# Patient Record
Sex: Male | Born: 1975
Health system: Southern US, Community
[De-identification: ages and names within clinical notes are randomized; demographics above are authoritative.]

## PROBLEM LIST (undated history)

## (undated) DIAGNOSIS — I10 Essential (primary) hypertension: Secondary | ICD-10-CM

## (undated) HISTORY — PX: JOINT REPLACEMENT: SHX530

## (undated) HISTORY — DX: Essential (primary) hypertension: I10

---

## 1999-06-15 HISTORY — PX: APPENDECTOMY: SHX54

## 2009-03-11 ENCOUNTER — Emergency Department (HOSPITAL_COMMUNITY): Admission: EM | Admit: 2009-03-11 | Discharge: 2009-03-11 | Payer: Self-pay | Admitting: Family Medicine

## 2010-09-18 LAB — POCT INFECTIOUS MONO SCREEN: Mono Screen: NEGATIVE

## 2012-03-25 ENCOUNTER — Emergency Department (HOSPITAL_COMMUNITY)
Admission: EM | Admit: 2012-03-25 | Discharge: 2012-03-26 | Disposition: A | Discharge status: Home / Self Care | Payer: BC Managed Care – PPO | Attending: Emergency Medicine | Admitting: Emergency Medicine

## 2012-03-25 ENCOUNTER — Encounter (HOSPITAL_COMMUNITY): Payer: Self-pay | Admitting: Adult Health

## 2012-03-25 DIAGNOSIS — W260XXA Contact with knife, initial encounter: Secondary | ICD-10-CM | POA: Insufficient documentation

## 2012-03-25 DIAGNOSIS — S61209A Unspecified open wound of unspecified finger without damage to nail, initial encounter: Secondary | ICD-10-CM

## 2012-03-25 NOTE — ED Notes (Signed)
While cutting veggies sliced tuft of thumb on left hand off. CMS intact. Tetanus shot greater than 5 year.

## 2012-03-25 NOTE — ED Notes (Signed)
The pt has  An evulsion type laceration to the lt thumb distal tip.  The skin is missing.   He cut it with a knife while cooking.  Bleeding  Controlled somewhat with the bandage  Did not remove to clean yet until ready for treatment

## 2012-03-26 MED ORDER — TETANUS-DIPHTH-ACELL PERTUSSIS 5-2.5-18.5 LF-MCG/0.5 IM SUSP
0.5000 mL | Freq: Once | INTRAMUSCULAR | Status: AC
  Administered 2012-03-26: 0.5 mL via INTRAMUSCULAR
  Filled 2012-03-26: qty 0.5

## 2012-03-26 MED ORDER — HYDROCODONE-ACETAMINOPHEN 5-325 MG PO TABS: 1.0000 | ORAL_TABLET | ORAL | Status: DC | PRN

## 2012-03-26 NOTE — ED Provider Notes (Signed)
History     CSN: [id number]  Arrival date & time 03/25/12  2302   First MD Initiated Contact with Patient 03/25/12 2336      Chief Complaint  Patient presents with  . Laceration   HPI  History provided by the patient. Patient is a 36 year old male with no significant PMH who presents with laceration to the tip of his left thumb. Patient states he was chopping onions and became distracted and when he looks like his daughter dancing actively cut the tip of his left thumb with a knife. Patient reports having an avulsion type laceration to the tip. There has been continuous bleeding that seemed improved with pressure and bandage. Patient has no prior history of any bleeding disorder condition. Patient is not a daily alcohol user. He has not used other treatments for symptoms.    History reviewed. No pertinent past medical history.  History reviewed. No pertinent past surgical history.  History reviewed. No pertinent family history.  History  Substance Use Topics  . Smoking status: Never Smoker   . Smokeless tobacco: Not on file  . Alcohol Use: Yes      Review of Systems  All other systems reviewed and are negative.    Allergies  Review of patient's allergies indicates no known allergies.  Home Medications  No current outpatient prescriptions on file.  BP 149/81  Pulse 88  Temp 98.3 F (36.8 C) (Oral)  Resp 18  SpO2 98%  Physical Exam  Nursing note and vitals reviewed. Constitutional: He appears well-developed and well-nourished. No distress.  HENT:  Head: Normocephalic.  Cardiovascular: Normal rate and regular rhythm.   No murmur heard. Pulmonary/Chest: Effort normal and breath sounds normal.  Musculoskeletal:       Small avulsion/amputation injury to the tip of left finger pad of thumb. There is small continuous bleeding from the area.  Skin: Skin is warm.  Psychiatric: He has a normal mood and affect.    ED Course  Procedures  LACERATION  REPAIR Performed by: Angus Seller Authorized by: Angus Seller Consent: Verbal consent obtained. Risks and benefits: risks, benefits and alternatives were discussed Consent given by: patient Patient identity confirmed: provided demographic data Prepped and Draped in normal sterile fashion Wound explored  Laceration Location: Tip of left thumb  Laceration Length: 1 cm  No Foreign Bodies seen or palpated  Anesthesia: None   Irrigation method: Lavage  Amount of cleaning: standard  Skin closure: None   Patient tolerance: Patient tolerated the procedure well with no immediate complications.   1. Avulsion of finger tip       MDM  12:45 AM patient seen and evaluated. Patient's wound was cleaned and bandaged with good hemostasis. Patient given dressing changing instructions. Will also provide orthopedic hand followup.        Angus Seller, [place] 03/26/12 2221

## 2012-03-27 NOTE — ED Provider Notes (Signed)
Medical screening examination/treatment/procedure(s) were performed by non-physician practitioner and as supervising physician I was immediately available for consultation/collaboration.  Lavan Imes M Simya Tercero, MD 03/27/12 0550 

## 2015-08-06 ENCOUNTER — Other Ambulatory Visit: Payer: Self-pay | Admitting: Internal Medicine

## 2015-08-06 DIAGNOSIS — R945 Abnormal results of liver function studies: Secondary | ICD-10-CM

## 2015-08-08 ENCOUNTER — Ambulatory Visit
Admission: RE | Admit: 2015-08-08 | Discharge: 2015-08-08 | Disposition: A | Discharge status: Home / Self Care | Payer: Self-pay | Source: Ambulatory Visit | Attending: Internal Medicine | Admitting: Internal Medicine

## 2015-08-08 DIAGNOSIS — R945 Abnormal results of liver function studies: Secondary | ICD-10-CM

## 2015-09-15 DIAGNOSIS — L700 Acne vulgaris: Secondary | ICD-10-CM | POA: Diagnosis not present

## 2015-09-15 DIAGNOSIS — D225 Melanocytic nevi of trunk: Secondary | ICD-10-CM | POA: Diagnosis not present

## 2015-09-15 DIAGNOSIS — Z86018 Personal history of other benign neoplasm: Secondary | ICD-10-CM | POA: Diagnosis not present

## 2015-09-15 DIAGNOSIS — L570 Actinic keratosis: Secondary | ICD-10-CM | POA: Diagnosis not present

## 2016-03-19 DIAGNOSIS — Z23 Encounter for immunization: Secondary | ICD-10-CM | POA: Diagnosis not present

## 2016-09-20 DIAGNOSIS — Z86018 Personal history of other benign neoplasm: Secondary | ICD-10-CM | POA: Diagnosis not present

## 2016-09-20 DIAGNOSIS — D1801 Hemangioma of skin and subcutaneous tissue: Secondary | ICD-10-CM | POA: Diagnosis not present

## 2016-09-20 DIAGNOSIS — L814 Other melanin hyperpigmentation: Secondary | ICD-10-CM | POA: Diagnosis not present

## 2016-09-20 DIAGNOSIS — D225 Melanocytic nevi of trunk: Secondary | ICD-10-CM | POA: Diagnosis not present

## 2016-09-27 DIAGNOSIS — R05 Cough: Secondary | ICD-10-CM | POA: Diagnosis not present

## 2016-09-27 DIAGNOSIS — J0100 Acute maxillary sinusitis, unspecified: Secondary | ICD-10-CM | POA: Diagnosis not present

## 2016-09-27 DIAGNOSIS — J302 Other seasonal allergic rhinitis: Secondary | ICD-10-CM | POA: Diagnosis not present

## 2017-01-25 ENCOUNTER — Other Ambulatory Visit: Payer: Self-pay | Admitting: Internal Medicine

## 2017-01-25 DIAGNOSIS — I493 Ventricular premature depolarization: Secondary | ICD-10-CM

## 2017-01-25 DIAGNOSIS — F430 Acute stress reaction: Secondary | ICD-10-CM | POA: Diagnosis not present

## 2017-01-25 DIAGNOSIS — R030 Elevated blood-pressure reading, without diagnosis of hypertension: Secondary | ICD-10-CM | POA: Diagnosis not present

## 2017-01-25 DIAGNOSIS — Z6828 Body mass index (BMI) 28.0-28.9, adult: Secondary | ICD-10-CM | POA: Diagnosis not present

## 2017-01-27 ENCOUNTER — Other Ambulatory Visit: Payer: Self-pay

## 2017-01-27 ENCOUNTER — Encounter (INDEPENDENT_AMBULATORY_CARE_PROVIDER_SITE_OTHER): Payer: Self-pay

## 2017-01-27 ENCOUNTER — Ambulatory Visit (HOSPITAL_COMMUNITY): Payer: BLUE CROSS/BLUE SHIELD | Attending: Cardiology

## 2017-01-27 DIAGNOSIS — I493 Ventricular premature depolarization: Secondary | ICD-10-CM | POA: Diagnosis not present

## 2017-01-27 DIAGNOSIS — R030 Elevated blood-pressure reading, without diagnosis of hypertension: Secondary | ICD-10-CM | POA: Diagnosis not present

## 2017-02-02 ENCOUNTER — Telehealth (HOSPITAL_COMMUNITY): Payer: Self-pay | Admitting: *Deleted

## 2017-02-02 DIAGNOSIS — I493 Ventricular premature depolarization: Secondary | ICD-10-CM

## 2017-02-02 DIAGNOSIS — I471 Supraventricular tachycardia: Secondary | ICD-10-CM

## 2017-02-02 NOTE — Telephone Encounter (Signed)
Per Dr Shirlee Latch pt needs 24 hour holter for pvcs and svt and f/u appt w/him next week, order placed will schedule

## 2017-02-08 ENCOUNTER — Ambulatory Visit (INDEPENDENT_AMBULATORY_CARE_PROVIDER_SITE_OTHER): Payer: BLUE CROSS/BLUE SHIELD

## 2017-02-08 DIAGNOSIS — I471 Supraventricular tachycardia: Secondary | ICD-10-CM | POA: Diagnosis not present

## 2017-02-08 DIAGNOSIS — I493 Ventricular premature depolarization: Secondary | ICD-10-CM | POA: Diagnosis not present

## 2017-02-10 ENCOUNTER — Encounter (HOSPITAL_COMMUNITY): Payer: Self-pay | Admitting: Cardiology

## 2017-02-10 ENCOUNTER — Ambulatory Visit (HOSPITAL_COMMUNITY)
Admission: RE | Admit: 2017-02-10 | Discharge: 2017-02-10 | Disposition: A | Discharge status: Home / Self Care | Payer: BLUE CROSS/BLUE SHIELD | Source: Ambulatory Visit | Attending: Cardiology | Admitting: Cardiology

## 2017-02-10 VITALS — BP 145/94 | HR 64 | Wt 208.5 lb

## 2017-02-10 DIAGNOSIS — I10 Essential (primary) hypertension: Secondary | ICD-10-CM | POA: Diagnosis not present

## 2017-02-10 DIAGNOSIS — I493 Ventricular premature depolarization: Secondary | ICD-10-CM | POA: Diagnosis not present

## 2017-02-10 DIAGNOSIS — Z8249 Family history of ischemic heart disease and other diseases of the circulatory system: Secondary | ICD-10-CM | POA: Insufficient documentation

## 2017-02-10 DIAGNOSIS — F909 Attention-deficit hyperactivity disorder, unspecified type: Secondary | ICD-10-CM | POA: Diagnosis not present

## 2017-02-10 DIAGNOSIS — Z87891 Personal history of nicotine dependence: Secondary | ICD-10-CM | POA: Insufficient documentation

## 2017-02-10 MED ORDER — NEBIVOLOL HCL 5 MG PO TABS: 5.0000 mg | ORAL_TABLET | Freq: Every day | ORAL | 3 refills | Status: DC

## 2017-02-10 NOTE — Patient Instructions (Signed)
Treadmill Exercise Stress Test has been ordered for you.  Coronary Calcium Score Scan has been ordered for you.  START Bystolic 5 mg (1 Tablet) Once Daily at bedtime  Follow up in 2 Months

## 2017-02-10 NOTE — Progress Notes (Addendum)
PCP: Dr. Timothy Lassousso  41 yo with history of untreated HTN presents for evaluation of PVCs. He has felt PVCs for years, generally they happen infrequently, maybe once a week or so.  However, for the last 2-3 weeks, he has noted much more frequent PVCs. He feels palpitations during much of the day.  He notes them most when still or lying in bed. He has been having trouble sleeping because of the PVCs. He takes Adderrall for ADHD but stopped it about 2 wks ago with no improvement.  No new stressor recently.  No new medications.  He has good exercise tolerance, no chest pain or dyspnea.  He has lost 20 lbs in the last year with diet/exercise.  No syncope or lightheadedness.    ECG today shows PVCs and holter monitor done earlier this week shows 8.9% PVCs, no NSVT or VT.  Echo was done showing normal EF but moderate concentric LVH.  The RV looked normal.   ECG (personally reviewed): NSR, occasional PVCs  PMH: 1. PVCs: Monomorphic.  - Echo (8/18): EF 55-60%, moderate concentric LVH, normal RV size and systolic function.  - Holter monitor (8/18): 8.9% PVC burden, no run of NSVT or VT.   2. HTN 3. ADHD  FH: Maternal GF with PAD in his 9340s.  Mother with CVA at 267.  Maternal uncle with MI at 10938.  Maternal uncle with MI at 2870.   SH: Smoked in college, nothing since then.  Occasional ETOH.  Married with 3 children.  [place] in [place]   ROS: All systems reviewed and negative except as per HPI.   Current Outpatient Prescriptions  Medication Sig Dispense Refill  . ibuprofen (ADVIL,MOTRIN) 200 MG tablet Take 400 mg by mouth every 6 (six) hours as needed. For pain    . nebivolol (BYSTOLIC) 5 MG tablet Take 1 tablet (5 mg total) by mouth at bedtime. 30 tablet 3   No current facility-administered medications for this encounter.    BP (!) 145/94   Pulse 64   Wt 208 lb 8 oz (94.6 kg)   SpO2 100%   General: NAD Neck: No JVD, no thyromegaly or thyroid nodule.  Lungs: Clear to auscultation bilaterally with  normal respiratory effort. CV: Nondisplaced PMI.  Heart regular S1/S2, no S3/S4, no murmur.  No peripheral edema.  No carotid bruit.  Normal pedal pulses.  Abdomen: Soft, nontender, no hepatosplenomegaly, no distention.  Skin: Intact without lesions or rashes.  Neurologic: Alert and oriented x 3.  Psych: Normal affect. Extremities: No clubbing or cyanosis.  HEENT: Normal.   Assessment/Plan: 1. HTN: BP is elevated today.  He has an automatic cuff with readings from home, generally in the 140s/90s.  His BP has been high for a number of months and is still elevated despite significant weight gain.  The LVH on echo suggests that the elevated BP is not just a recent stress response to distress from the PVCs.  - I recommended that he start on nebivolol at 5 mg daily with high BP and frequent PVCs.   - Continue exercise/weight loss.  2. PVCs: He has noted PVCs for years but considerably more in the last 2-3 weeks.  They have been keeping him up/making it hard to sleep.  Holter showed monomorphic PVCs, no NSVT runs.  PVCs comprised 8.9% of his total QRS complexes.  This is not to the point where I would be concerned for the development of PVC-mediated cardiomyopathy, but this is a large number of PVCs.  No  family history of sudden death.  - As above, I will start him on nebivolol both for BP control and to try to decrease PVC frequency and give him some symptomatic relief.   - I will arrange an ETT to screen for ischemia and to see the response of the PVCs to exercise.   - He has moderate concentric LVH on echo.  This is most likely due to HTN, but must keep hypertrophic cardiomyopathy on the differential.  3. CAD risk: He has a strong family history of premature CAD.  - ETT as above for risk stratification.  - I would also like him to get a coronary calcium scan for risk stratification.  - Check lipids. If there is significant coronary calcium, low threshold for statin.   Marca Ancona 02/10/2017  Patient's ETT showed a hypertensive BP response but otherwise good exercise tolerance and no ischemic changes.  Decreased PVCs with exercise.  Hypertensive BP response.  Coronary calcium score 0, low risk for future events.  Lipids ok.   I reviewed with Dr. 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  Doubt ARVC.  Still having symptomatic PVCs so will increase bisoprolol to 10 mg daily with elevated BP.  If this does not help, will transition to verapamil CR.  PVC ablation would be an option if needed (hopefully not).   Marca Ancona 02/25/2017

## 2017-02-11 ENCOUNTER — Telehealth (HOSPITAL_COMMUNITY): Payer: Self-pay | Admitting: Pharmacist

## 2017-02-11 ENCOUNTER — Encounter (HOSPITAL_COMMUNITY): Payer: Self-pay | Admitting: Pharmacist

## 2017-02-11 MED ORDER — BISOPROLOL FUMARATE 5 MG PO TABS: 5.0000 mg | ORAL_TABLET | Freq: Every day | ORAL | 5 refills | Status: DC

## 2017-02-11 NOTE — Telephone Encounter (Signed)
Bystolic denied by [employer]. Will attempt appeal but in the meantime will start Mr. Ostrand on bisoprolol 5 mg daily per discussion with Dr. Shirlee LatchMcLean. Relayed info to Mr. Lubitz who verbalized understanding.   Tyler DeisErika K. Bonnye FavaNicolsen, PharmD, BCPS, CPP Clinical Pharmacist Pager: [phone] Phone: [phone] 02/11/2017 2:58 PM

## 2017-02-18 ENCOUNTER — Telehealth (HOSPITAL_COMMUNITY): Payer: Self-pay | Admitting: Pharmacist

## 2017-02-18 NOTE — Telephone Encounter (Signed)
Bystolic appeal again denied by [employer].   Tyler DeisErika K. Bonnye FavaNicolsen, PharmD, BCPS, CPP Clinical Pharmacist Pager: [phone] Phone: [phone] 02/18/2017 10:37 AM

## 2017-02-24 ENCOUNTER — Ambulatory Visit (INDEPENDENT_AMBULATORY_CARE_PROVIDER_SITE_OTHER): Payer: BLUE CROSS/BLUE SHIELD

## 2017-02-24 ENCOUNTER — Ambulatory Visit (INDEPENDENT_AMBULATORY_CARE_PROVIDER_SITE_OTHER)
Admission: RE | Admit: 2017-02-24 | Discharge: 2017-02-24 | Disposition: A | Discharge status: Home / Self Care | Payer: Self-pay | Source: Ambulatory Visit | Attending: Cardiology | Admitting: Cardiology

## 2017-02-24 ENCOUNTER — Other Ambulatory Visit: Payer: BLUE CROSS/BLUE SHIELD | Admitting: *Deleted

## 2017-02-24 DIAGNOSIS — Z8249 Family history of ischemic heart disease and other diseases of the circulatory system: Secondary | ICD-10-CM

## 2017-02-24 LAB — EXERCISE TOLERANCE TEST
CHL CUP MPHR: 181 {beats}/min
CHL CUP RESTING HR STRESS: 69 {beats}/min
CHL RATE OF PERCEIVED EXERTION: 15
CSEPEDS: 0 s
CSEPHR: 93 %
CSEPPHR: 169 {beats}/min
Estimated workload: 13.4 METS
Exercise duration (min): 11 min

## 2017-02-24 LAB — LIPID PANEL
CHOL/HDL RATIO: 4.1 ratio (ref 0.0–5.0)
Cholesterol, Total: 188 mg/dL (ref 100–199)
HDL: 46 mg/dL (ref >39)
LDL Calculated: 106 mg/dL — ABNORMAL HIGH (ref 0–99)
Triglycerides: 180 mg/dL — ABNORMAL HIGH (ref 0–149)
VLDL CHOLESTEROL CAL: 36 mg/dL (ref 5–40)

## 2017-02-24 NOTE — Addendum Note (Signed)
Addended by: Tonita PhoenixBOWDEN, Adaisha Campise K on: 02/24/2017 08:44 AM   Modules accepted: Orders

## 2017-02-25 NOTE — Addendum Note (Signed)
Encounter addended by: Laurey Morale, MD on: 02/25/2017  2:47 PM<BR>    Actions taken: Sign clinical note

## 2017-03-23 ENCOUNTER — Other Ambulatory Visit (HOSPITAL_COMMUNITY): Payer: Self-pay

## 2017-03-23 ENCOUNTER — Other Ambulatory Visit (HOSPITAL_COMMUNITY): Payer: Self-pay | Admitting: *Deleted

## 2017-03-23 MED ORDER — BISOPROLOL FUMARATE 5 MG PO TABS: 5.0000 mg | ORAL_TABLET | Freq: Every day | ORAL | 5 refills | Status: DC

## 2017-03-25 ENCOUNTER — Other Ambulatory Visit (HOSPITAL_COMMUNITY): Payer: Self-pay | Admitting: *Deleted

## 2017-03-25 MED ORDER — BISOPROLOL FUMARATE 10 MG PO TABS: 10.0000 mg | ORAL_TABLET | Freq: Every day | ORAL | 3 refills | Status: DC

## 2017-04-12 ENCOUNTER — Ambulatory Visit (HOSPITAL_COMMUNITY)
Admission: RE | Admit: 2017-04-12 | Discharge: 2017-04-12 | Disposition: A | Discharge status: Home / Self Care | Payer: BLUE CROSS/BLUE SHIELD | Source: Ambulatory Visit | Attending: Cardiology | Admitting: Cardiology

## 2017-04-12 ENCOUNTER — Encounter (HOSPITAL_COMMUNITY): Payer: Self-pay | Admitting: Cardiology

## 2017-04-12 VITALS — BP 141/84 | HR 56 | Wt 214.2 lb

## 2017-04-12 DIAGNOSIS — Z8249 Family history of ischemic heart disease and other diseases of the circulatory system: Secondary | ICD-10-CM | POA: Insufficient documentation

## 2017-04-12 DIAGNOSIS — I10 Essential (primary) hypertension: Secondary | ICD-10-CM | POA: Diagnosis not present

## 2017-04-12 DIAGNOSIS — I2510 Atherosclerotic heart disease of native coronary artery without angina pectoris: Secondary | ICD-10-CM | POA: Diagnosis not present

## 2017-04-12 DIAGNOSIS — E0590 Thyrotoxicosis, unspecified without thyrotoxic crisis or storm: Secondary | ICD-10-CM | POA: Diagnosis not present

## 2017-04-12 DIAGNOSIS — I493 Ventricular premature depolarization: Secondary | ICD-10-CM

## 2017-04-12 DIAGNOSIS — F909 Attention-deficit hyperactivity disorder, unspecified type: Secondary | ICD-10-CM | POA: Insufficient documentation

## 2017-04-12 LAB — BASIC METABOLIC PANEL
ANION GAP: 9 (ref 5–15)
BUN: 12 mg/dL (ref 6–20)
CALCIUM: 9.5 mg/dL (ref 8.9–10.3)
CO2: 26 mmol/L (ref 22–32)
Chloride: 102 mmol/L (ref 101–111)
Creatinine, Ser: 0.93 mg/dL (ref 0.61–1.24)
Glucose, Bld: 113 mg/dL — ABNORMAL HIGH (ref 65–99)
Potassium: 5.1 mmol/L (ref 3.5–5.1)
SODIUM: 137 mmol/L (ref 135–145)

## 2017-04-12 LAB — TSH: TSH: 1.343 u[IU]/mL (ref 0.350–4.500)

## 2017-04-12 MED ORDER — VERAPAMIL HCL ER 180 MG PO TBCR: 180.0000 mg | EXTENDED_RELEASE_TABLET | Freq: Every day | ORAL | 3 refills | Status: DC

## 2017-04-12 NOTE — Progress Notes (Signed)
PCP: Dr. Timothy Morgan Cardiology: Dr. Shirlee Morgan  41 yo with history of HTN and PVCs presents for followup of high BP and palpitations. He has felt PVCs for years, generally they happen infrequently, maybe once a week or so.  However, prior to his initial appointment, he noted much more frequent PVCs. He felt palpitations during much of the day.  He noted them most when still or lying in bed. He had trouble sleeping because of the PVCs.   Holter monitor in 8/18 showed 8.9% PVCs, no NSVT or VT.  Echo was done showing normal EF but moderate concentric LVH.  The RV looked normal.  ETT showed hypertensive BP response but no evidence for ischemia and good exercise tolerance.  PVCs decreased with exercise.  Coronary artery calcium score was 0.   I started him on bisoprolol and increased it to 10 mg daily.  He still feels PVCs but not as bad. He is sleeping better . SBP still running in the 140s-150s at times.  He feels more fatigued with bisoprolol. He does not feel PVCs with exertion. No exertional dyspnea or chest pain.  He gets frequent exercise.   Labs (9/18): LDL 106  PMH: 1. PVCs: Monomorphic.  - Echo (8/18): EF 55-60%, moderate concentric LVH, normal RV size and systolic function.  - Holter monitor (8/18): 8.9% PVC burden, no run of NSVT or VT.   - ETT (9/18):  Hypertensive BP response but otherwise good exercise tolerance and no ischemic changes.  Decreased PVCs with exercise. 2. HTN 3. ADHD 4. Coronary artery calcium score (9/18): 0 Agatston units.   FH: Maternal GF with PAD in his 60s.  Mother with CVA at 2.  Maternal uncle with MI at 6.  Maternal uncle with MI at 34.   SH: Smoked in college, nothing since then.  Occasional ETOH.  Married with 3 children.  Lives in [place].   ROS: All systems reviewed and negative except as per HPI.   Current Outpatient Prescriptions  Medication Sig Dispense Refill  . ibuprofen (ADVIL,MOTRIN) 200 MG tablet Take 400 mg by mouth every 6 (six) hours as needed.  For pain    . verapamil (CALAN-SR) 180 MG CR tablet Take 1 tablet (180 mg total) by mouth at bedtime. 30 tablet 3   No current facility-administered medications for this encounter.    BP (!) 141/84   Pulse (!) 56   Wt 214 lb 4 oz (97.2 kg)   SpO2 100%   General: NAD Neck: No JVD, no thyromegaly or thyroid nodule.  Lungs: Clear to auscultation bilaterally with normal respiratory effort. CV: Nondisplaced PMI.  Heart regular S1/S2, no S3/S4, no murmur.  No peripheral edema.  No carotid bruit.  Normal pedal pulses.  Abdomen: Soft, nontender, no hepatosplenomegaly, no distention.  Skin: Intact without lesions or rashes.  Neurologic: Alert and oriented x 3.  Psych: Normal affect. Extremities: No clubbing or cyanosis.  HEENT: Normal.   Assessment/Plan: 1. HTN: BP remains elevated on bisoprolol. As he has been fatigued with bisoprolol and still feels palpitations, I think that I will try a different agent.  - Stop bisoprolol and start verapamil CR 180 mg daily.  - He may end up needing a 2nd agent for BP control. - Continue exercise/weight loss.  2. PVCs: He has noted PVCs for years but considerably more in the last few weeks.  They were initially keeping him up/making it hard to sleep.  Holter showed monomorphic PVCs, no NSVT runs.  PVCs comprised 8.9% of  his total QRS complexes.  This is not to the point where I would be concerned for the development of PVC-mediated cardiomyopathy, but this is a large number of PVCs.  No family history of sudden death.  ETT showed no ischemic changes and PVCs decreased with exercise. Doubt ARVC.  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 Johney Morgan - who agrees).  - Some relief with bisoprolol but still with significant symptoms.  Stop bisoprolol and start verapamil CR 180 mg daily. Can titrate this up if needed. - He has moderate concentric LVH on echo.  This is most likely due to HTN and less likely from hypertrophic cardiomyopathy.  3. CAD  risk: He has a strong family history of premature CAD.  ETT was low risk.  Coronary artery calcium score 0 Agatston units suggests low risk for future cardiac events.  With excellent calcium score, would not treat with statin at this point (LDL 106).   Jeffery Morgan 04/12/2017

## 2017-04-12 NOTE — Patient Instructions (Signed)
Stop Bisoprolol, but do not discard medication  Start Verapamil 180 mg daily  Labs drawn today (if we do not call you, then your lab work was stable)   Your physician has requested that you regularly monitor and record your blood pressure readings at home. Please use the same machine at the same time of day to check your readings and record them and call them in in 2 weeks [phone] opt. 5  Your physician recommends that you schedule a follow-up appointment in: 2 month with Dr. Shirlee LatchMcLean

## 2017-06-15 ENCOUNTER — Encounter (HOSPITAL_COMMUNITY): Payer: Self-pay | Admitting: Cardiology

## 2017-06-15 ENCOUNTER — Ambulatory Visit (HOSPITAL_COMMUNITY)
Admission: RE | Admit: 2017-06-15 | Discharge: 2017-06-15 | Disposition: A | Discharge status: Home / Self Care | Payer: BLUE CROSS/BLUE SHIELD | Source: Ambulatory Visit | Attending: Cardiology | Admitting: Cardiology

## 2017-06-15 VITALS — BP 162/96 | HR 79 | Wt 225.8 lb

## 2017-06-15 DIAGNOSIS — F909 Attention-deficit hyperactivity disorder, unspecified type: Secondary | ICD-10-CM | POA: Diagnosis not present

## 2017-06-15 DIAGNOSIS — Z8249 Family history of ischemic heart disease and other diseases of the circulatory system: Secondary | ICD-10-CM | POA: Insufficient documentation

## 2017-06-15 DIAGNOSIS — I493 Ventricular premature depolarization: Secondary | ICD-10-CM | POA: Diagnosis not present

## 2017-06-15 DIAGNOSIS — I10 Essential (primary) hypertension: Secondary | ICD-10-CM | POA: Diagnosis not present

## 2017-06-15 MED ORDER — CHLORTHALIDONE 25 MG PO TABS: 12.5000 mg | ORAL_TABLET | Freq: Every day | ORAL | 3 refills | Status: DC

## 2017-06-15 MED ORDER — VERAPAMIL HCL ER 180 MG PO TBCR: 360.0000 mg | EXTENDED_RELEASE_TABLET | Freq: Every day | ORAL | 3 refills | Status: DC

## 2017-06-15 NOTE — Patient Instructions (Signed)
Increase Verapamil 360 mg (2 tabs) daily  Check and record BP daily and send in results in 2 weeks  Based on BP- Start Chlorthalidone 12.5 mg (1/2 tab) daily  Your physician recommends that you schedule a follow-up appointment in: 6 months with Dr. Shirlee LatchMcLean  (we will call you)

## 2017-06-16 NOTE — Progress Notes (Signed)
PCP: Dr. Timothy Lasso Cardiology: Dr. Shirlee Latch  42 y.o.with history of HTN and PVCs presents for followup of high BP and palpitations. He has felt PVCs for years, generally they happen infrequently, maybe once a week or so.  However, prior to his initial appointment, he noted much more frequent PVCs. He felt palpitations during much of the day.  He noted them most when still or lying in bed. He had trouble sleeping because of the PVCs.   Holter monitor in 8/18 showed 8.9% PVCs, no NSVT or VT.  Echo was done showing normal EF but moderate concentric LVH.  The RV looked normal.  ETT showed hypertensive BP response but no evidence for ischemia and good exercise tolerance.  PVCs decreased with exercise.  Coronary artery calcium score was 0.   I started him on bisoprolol and increased it to 10 mg daily.  He continued to feel PVCs and felt fatigued, so I switched him to verapamil CR 180 mg daily.  He seems to be doing better with verapamil, less palpitations and no fatigue.  He has gained about 11 lbs over the holidays.  BP continues to run high when he checks at home and is high here today as well.  No chest pain or exertional dyspnea.    Labs (9/18): LDL 106 Labs (10/18): K 5.1, creatinine 0.93, TSH normal  PMH: 1. PVCs: Monomorphic.  - Echo (8/18): EF 55-60%, moderate concentric LVH, normal RV size and systolic function.  - Holter monitor (8/18): 8.9% PVC burden, no run of NSVT or VT.   - ETT (9/18):  Hypertensive BP response but otherwise good exercise tolerance and no ischemic changes.  Decreased PVCs with exercise. 2. HTN 3. ADHD 4. Coronary artery calcium score (9/18): 0 Agatston units.   FH: Maternal GF with PAD in his 91s.  Mother with CVA at 43.  Maternal uncle with MI at 9.  Maternal uncle with MI at 36.   SH: Smoked in college, nothing since then.  Occasional ETOH.  Married with 3 children.  Lives in [place].   ROS: All systems reviewed and negative except as per HPI.   Current Outpatient  Medications  Medication Sig Dispense Refill  . ibuprofen (ADVIL,MOTRIN) 200 MG tablet Take 400 mg by mouth every 6 (six) hours as needed. For pain    . verapamil (CALAN-SR) 180 MG CR tablet Take 2 tablets (360 mg total) by mouth at bedtime. 60 tablet 3  . [START ON 06/29/2017] chlorthalidone (HYGROTON) 25 MG tablet Take 0.5 tablets (12.5 mg total) by mouth daily. 15 tablet 3   No current facility-administered medications for this encounter.    BP (!) 162/96   Pulse 79   Wt 225 lb 12.8 oz (102.4 kg)   SpO2 97%   General: NAD Neck: No JVD, no thyromegaly or thyroid nodule.  Lungs: Clear to auscultation bilaterally with normal respiratory effort. CV: Nondisplaced PMI.  Heart regular S1/S2, no S3/S4, no murmur.  No peripheral edema.  No carotid bruit.  Normal pedal pulses.  Abdomen: Soft, nontender, no hepatosplenomegaly, no distention.  Skin: Intact without lesions or rashes.  Neurologic: Alert and oriented x 3.  Psych: Normal affect. Extremities: No clubbing or cyanosis.  HEENT: Normal.   Assessment/Plan: 1. HTN: BP remains elevated on verapamil CR.   - Increase verapamil CR to 360 mg daily.  - He will check BP daily and let me know what pressures are running in about 2 wks.  If still high, will start chlorthalidone 12.5 mg  daily with BMET in 2 wks after that.  - Continue exercise/weight loss, gained weight over holidays.  2. PVCs: He has noted PVCs for years but considerably more earlier this year.  They were initially keeping him up/making it hard to sleep.  Holter showed monomorphic PVCs, no NSVT runs.  PVCs comprised 8.9% of his total QRS complexes.  This is not to the point where I would be concerned for the development of PVC-mediated cardiomyopathy, but this is a large number of PVCs.  No family history of sudden death.  ETT showed no ischemic changes and PVCs decreased with exercise. Doubt ARVC.  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 Johney FrameAllred - who agrees).  -  Verapamil CR seems to be doing a reasonable job of PVC suppression, and planning to increase dose today.  - He has moderate concentric LVH on echo.  This is most likely due to HTN and less likely from hypertrophic cardiomyopathy.  3. CAD risk: He has a strong family history of premature CAD.  ETT was low risk.  Coronary artery calcium score 0 Agatston units suggests low risk for future cardiac events.  With excellent calcium score, would not treat with statin at this point (LDL most recently 106).   Marca AnconaDalton Philicia Heyne 06/16/2017

## 2017-07-28 ENCOUNTER — Telehealth (HOSPITAL_COMMUNITY): Payer: Self-pay

## 2017-07-28 DIAGNOSIS — I493 Ventricular premature depolarization: Secondary | ICD-10-CM

## 2017-07-28 NOTE — Telephone Encounter (Signed)
Patient called back, lab appointment scheduled and bmet ordered.

## 2017-07-28 NOTE — Telephone Encounter (Signed)
Per Dr. Shirlee LatchMcLean  Pt needs BMET in 2 weeks.

## 2017-08-11 ENCOUNTER — Ambulatory Visit (HOSPITAL_COMMUNITY)
Admission: RE | Admit: 2017-08-11 | Discharge: 2017-08-11 | Disposition: A | Discharge status: Home / Self Care | Payer: BLUE CROSS/BLUE SHIELD | Source: Ambulatory Visit | Attending: Internal Medicine | Admitting: Internal Medicine

## 2017-08-11 DIAGNOSIS — I493 Ventricular premature depolarization: Secondary | ICD-10-CM

## 2017-08-11 LAB — BASIC METABOLIC PANEL
Anion gap: 12 (ref 5–15)
BUN: 12 mg/dL (ref 6–20)
CHLORIDE: 97 mmol/L — AB (ref 101–111)
CO2: 27 mmol/L (ref 22–32)
Calcium: 9.2 mg/dL (ref 8.9–10.3)
Creatinine, Ser: 1.01 mg/dL (ref 0.61–1.24)
GFR calc Af Amer: >60 mL/min (ref >60)
GLUCOSE: 111 mg/dL — AB (ref 65–99)
POTASSIUM: 4 mmol/L (ref 3.5–5.1)
Sodium: 136 mmol/L (ref 135–145)

## 2017-08-17 ENCOUNTER — Encounter (HOSPITAL_COMMUNITY): Payer: Self-pay

## 2017-08-17 ENCOUNTER — Telehealth (HOSPITAL_COMMUNITY): Payer: Self-pay

## 2017-08-17 NOTE — Telephone Encounter (Signed)
Notes recorded by Teresa CoombsGunter, Anays Detore, RN on 08/17/2017 at 11:40 AM EST I have been unable to reach this patient by phone. A letter is being sent.  ------  Notes recorded by Modesta MessingBrown, Jasmine Q, CMA on 08/11/2017 at 4:06 PM EST Left VM for pt to call for lab results ------  Notes recorded by Laurey MoraleMcLean, Dalton S, MD on 08/11/2017 at 2:38 PM EST Labs are ok. Please let him know.

## 2017-09-08 ENCOUNTER — Other Ambulatory Visit (HOSPITAL_COMMUNITY): Payer: Self-pay | Admitting: *Deleted

## 2017-09-08 MED ORDER — CHLORTHALIDONE 25 MG PO TABS: 25.0000 mg | ORAL_TABLET | Freq: Every day | ORAL | 3 refills | Status: DC

## 2017-09-20 DIAGNOSIS — L814 Other melanin hyperpigmentation: Secondary | ICD-10-CM | POA: Diagnosis not present

## 2017-09-20 DIAGNOSIS — D1801 Hemangioma of skin and subcutaneous tissue: Secondary | ICD-10-CM | POA: Diagnosis not present

## 2017-09-20 DIAGNOSIS — Z86018 Personal history of other benign neoplasm: Secondary | ICD-10-CM | POA: Diagnosis not present

## 2017-09-20 DIAGNOSIS — D225 Melanocytic nevi of trunk: Secondary | ICD-10-CM | POA: Diagnosis not present

## 2017-10-27 DIAGNOSIS — H109 Unspecified conjunctivitis: Secondary | ICD-10-CM | POA: Diagnosis not present

## 2018-01-21 IMAGING — CT CT HEART SCORING
2 series · 16 of 20 positions shown, 18 images · non-contrast
Comparison: None.

CLINICAL DATA: Risk stratification

EXAM:
Coronary Calcium Score
TECHNIQUE: The patient was scanned on a Siemens Force scanner. Axial
non-contrast 3 mm slices were carried out through the heart. The
data set was analyzed on a dedicated work station and scored using
the Agatson method.

[Series 3: casc 3.0 i36f 2 bestdiast 70 % · axial · 0.37mm/px · z∈[-238,-146]mm · 8 of 41 slices shown, 10 images]
[im 5/41  vessel]
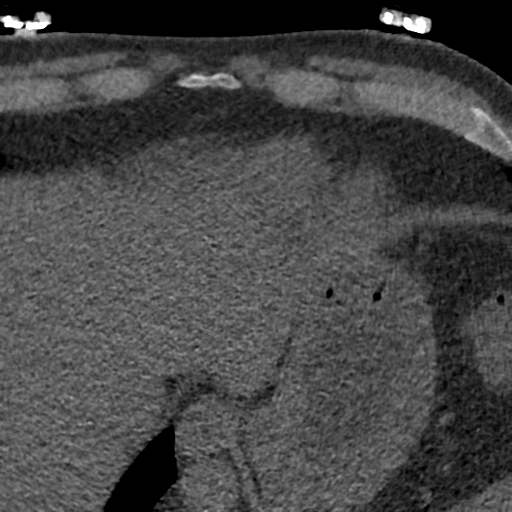
[im 5/41  lung]
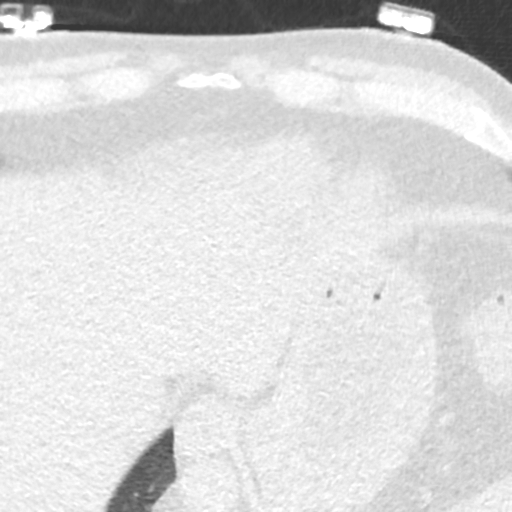
[im 9/41  vessel]
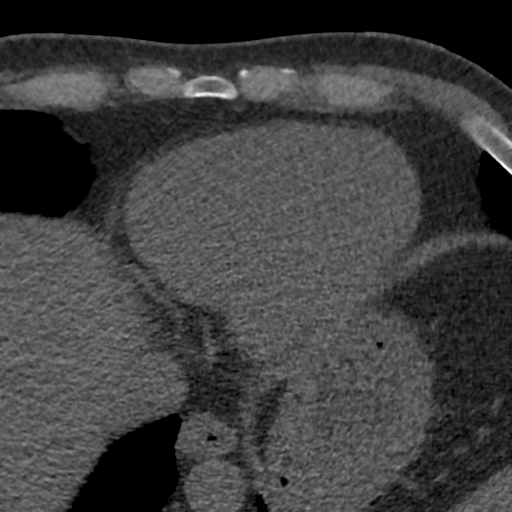
[im 14/41  vessel]
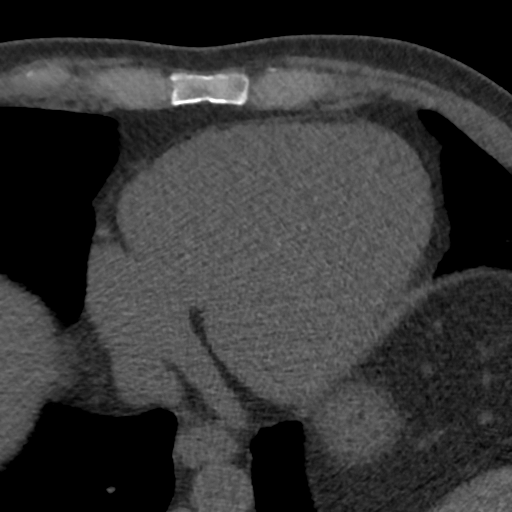
[im 18/41  vessel]
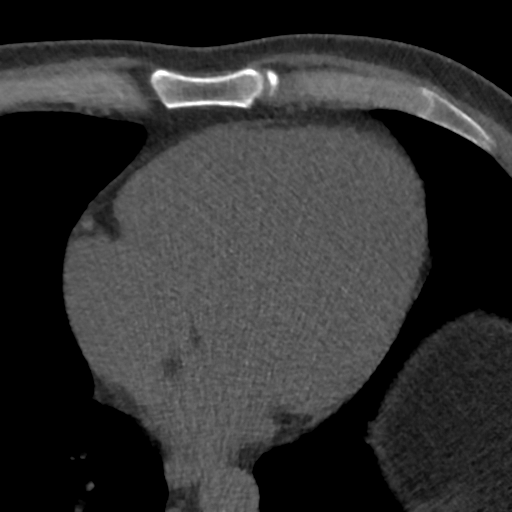
[im 23/41  vessel]
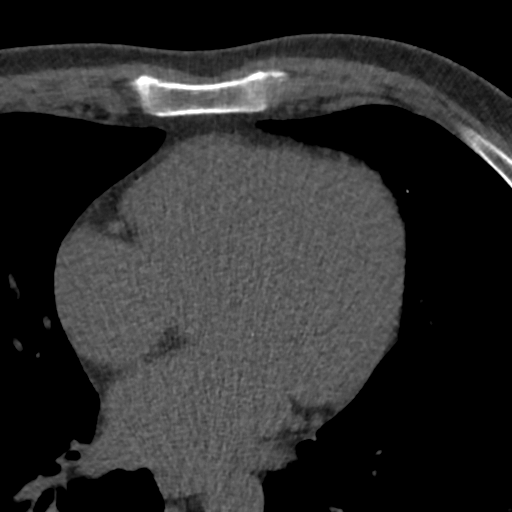
[im 23/41  lung]
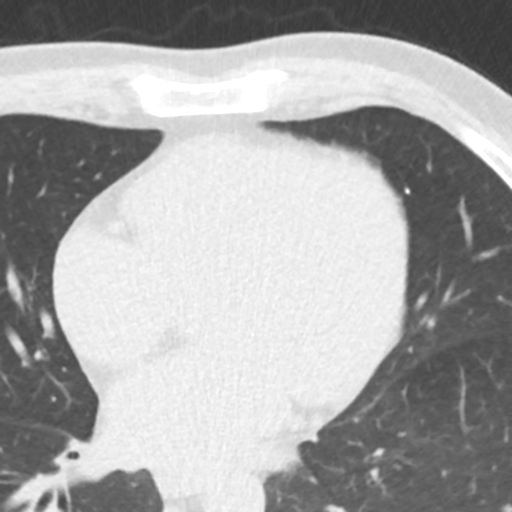
[im 27/41  vessel]
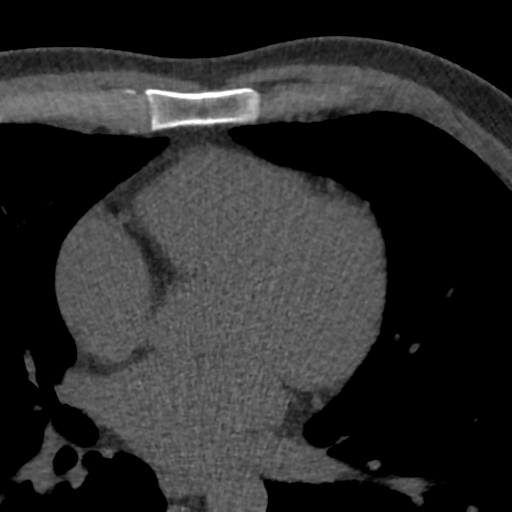
[im 32/41  vessel]
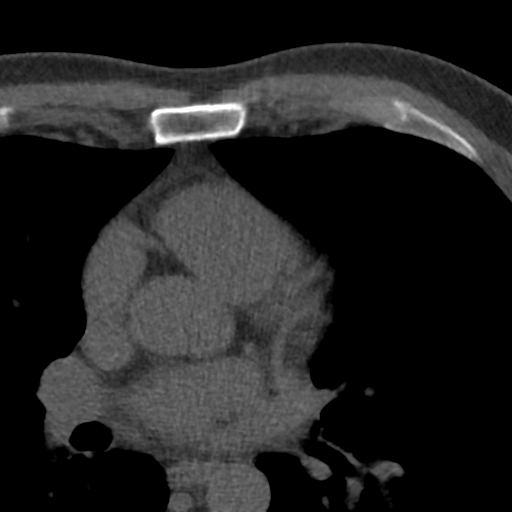
[im 36/41  vessel]
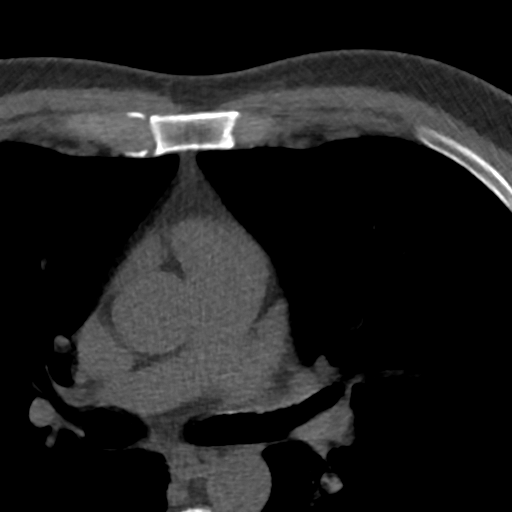

[Series 5: lung st 70 % · axial · 0.74mm/px · z∈[-238,-146]mm · 8 of 41 slices shown]
[im 5/41  lung]
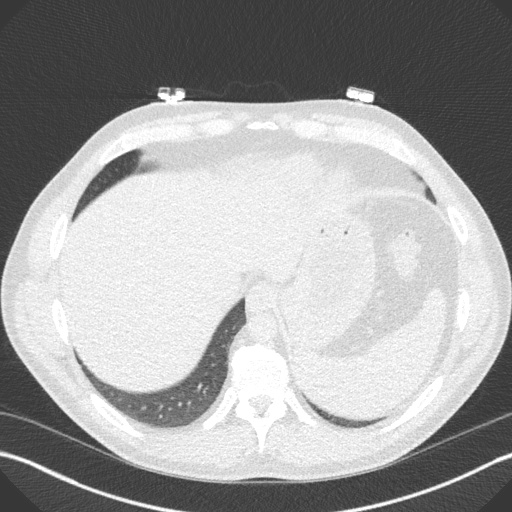
[im 9/41  lung]
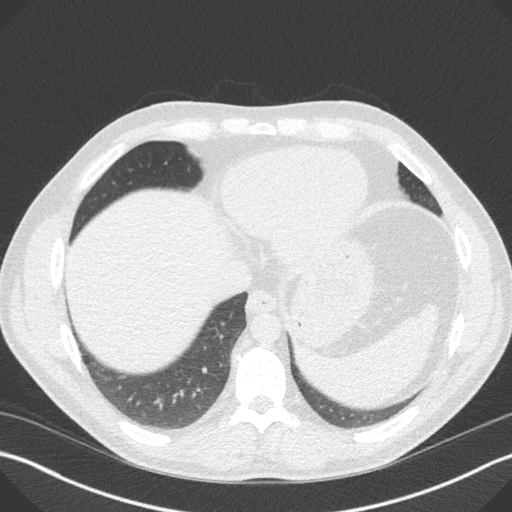
[im 14/41  lung]
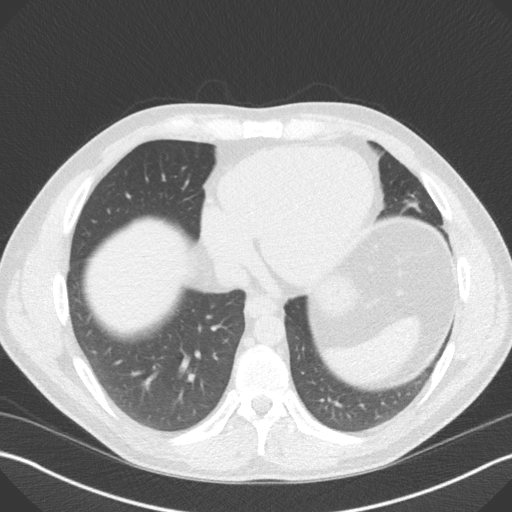
[im 18/41  lung]
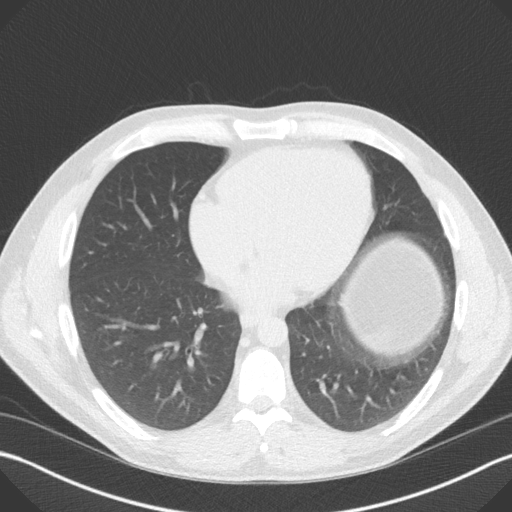
[im 23/41  lung]
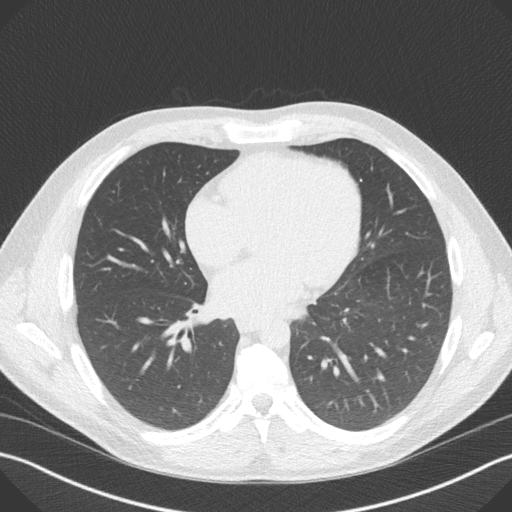
[im 27/41  lung]
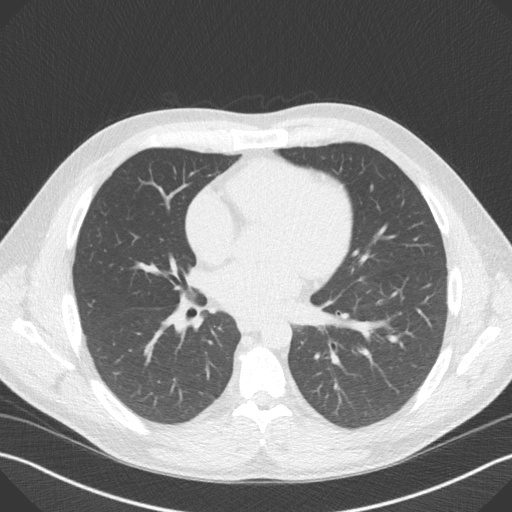
[im 32/41  lung]
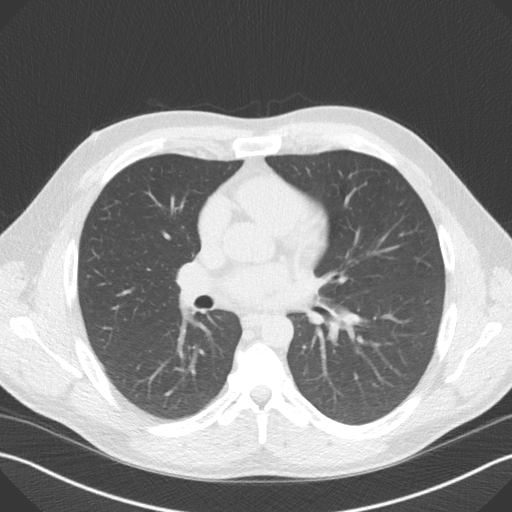
[im 36/41  lung]
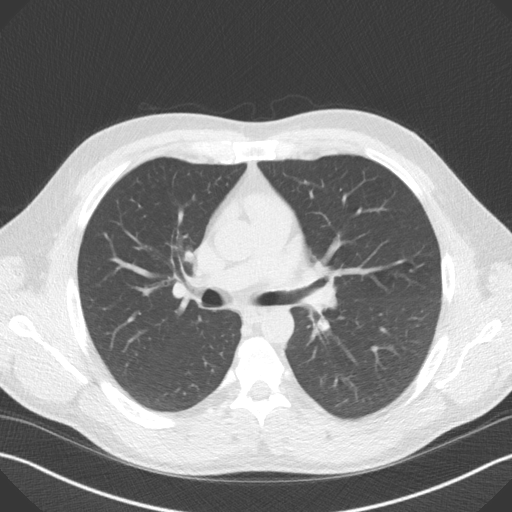

[16 of 20 positions shown; findings below may reference images not displayed]

FINDINGS: Non-cardiac: See separate report from [REDACTED].

Ascending Aorta:  Normal size, no calcifications.

Pericardium: Normal.

Coronary arteries:  Normal origin.
IMPRESSION: Coronary calcium score of 0. This was 0 percentile for age and sex
matched control.

Chelsie Jim

EXAM:
OVER-READ INTERPRETATION  CT CHEST

The following report is an over-read performed by radiologist Dr.
Julgen Ariela [REDACTED] on 02/24/2017. This over-read
does not include interpretation of cardiac or coronary anatomy or
pathology. The coronary calcium score interpretation by the
cardiologist is attached.
FINDINGS: Cardiovascular: Heart is normal size. Visualized aorta normal
caliber.

Mediastinum/Nodes: No adenopathy in the lower mediastinum or hila.

Lungs/Pleura: Visualized lungs clear.  No effusions.

Upper Abdomen: Imaging into the upper abdomen shows no acute
findings.

Musculoskeletal: Chest wall soft tissues are unremarkable. No acute
bony abnormality.
IMPRESSION: No acute or significant extracardiac abnormality.

## 2018-03-16 ENCOUNTER — Other Ambulatory Visit (HOSPITAL_COMMUNITY): Payer: Self-pay | Admitting: Cardiology

## 2018-03-16 DIAGNOSIS — I10 Essential (primary) hypertension: Secondary | ICD-10-CM

## 2018-03-17 ENCOUNTER — Other Ambulatory Visit (HOSPITAL_COMMUNITY): Payer: Self-pay

## 2018-03-17 DIAGNOSIS — I10 Essential (primary) hypertension: Secondary | ICD-10-CM

## 2018-03-17 MED ORDER — VERAPAMIL HCL ER 180 MG PO TBCR: 360.0000 mg | EXTENDED_RELEASE_TABLET | Freq: Every day | ORAL | 0 refills | Status: DC

## 2018-03-20 DIAGNOSIS — Z23 Encounter for immunization: Secondary | ICD-10-CM | POA: Diagnosis not present

## 2018-09-17 ENCOUNTER — Other Ambulatory Visit (HOSPITAL_COMMUNITY): Payer: Self-pay | Admitting: Cardiology

## 2018-09-17 DIAGNOSIS — I10 Essential (primary) hypertension: Secondary | ICD-10-CM

## 2018-09-20 ENCOUNTER — Other Ambulatory Visit (HOSPITAL_COMMUNITY): Payer: Self-pay | Admitting: Cardiology

## 2018-10-15 ENCOUNTER — Other Ambulatory Visit (HOSPITAL_COMMUNITY): Payer: Self-pay | Admitting: Cardiology

## 2018-10-15 DIAGNOSIS — I10 Essential (primary) hypertension: Secondary | ICD-10-CM

## 2018-10-26 ENCOUNTER — Other Ambulatory Visit (HOSPITAL_COMMUNITY): Payer: Self-pay | Admitting: Cardiology

## 2018-11-08 DIAGNOSIS — M25511 Pain in right shoulder: Secondary | ICD-10-CM | POA: Diagnosis not present

## 2018-11-18 ENCOUNTER — Other Ambulatory Visit (HOSPITAL_COMMUNITY): Payer: Self-pay | Admitting: Cardiology

## 2018-11-18 DIAGNOSIS — I10 Essential (primary) hypertension: Secondary | ICD-10-CM

## 2018-12-27 ENCOUNTER — Other Ambulatory Visit (HOSPITAL_COMMUNITY): Payer: Self-pay

## 2018-12-27 DIAGNOSIS — I10 Essential (primary) hypertension: Secondary | ICD-10-CM

## 2018-12-27 MED ORDER — VERAPAMIL HCL ER 180 MG PO TBCR: EXTENDED_RELEASE_TABLET | ORAL | 0 refills | Status: DC

## 2019-01-10 ENCOUNTER — Other Ambulatory Visit (HOSPITAL_COMMUNITY): Payer: Self-pay | Admitting: Cardiology

## 2019-01-10 DIAGNOSIS — I10 Essential (primary) hypertension: Secondary | ICD-10-CM

## 2019-02-12 DIAGNOSIS — L723 Sebaceous cyst: Secondary | ICD-10-CM | POA: Diagnosis not present

## 2019-02-12 DIAGNOSIS — D225 Melanocytic nevi of trunk: Secondary | ICD-10-CM | POA: Diagnosis not present

## 2019-02-12 DIAGNOSIS — Z86018 Personal history of other benign neoplasm: Secondary | ICD-10-CM | POA: Diagnosis not present

## 2019-02-12 DIAGNOSIS — L814 Other melanin hyperpigmentation: Secondary | ICD-10-CM | POA: Diagnosis not present

## 2019-05-29 DIAGNOSIS — Z20828 Contact with and (suspected) exposure to other viral communicable diseases: Secondary | ICD-10-CM | POA: Diagnosis not present

## 2019-05-31 DIAGNOSIS — Z20828 Contact with and (suspected) exposure to other viral communicable diseases: Secondary | ICD-10-CM | POA: Diagnosis not present

## 2019-06-02 DIAGNOSIS — Z20828 Contact with and (suspected) exposure to other viral communicable diseases: Secondary | ICD-10-CM | POA: Diagnosis not present

## 2019-07-03 ENCOUNTER — Other Ambulatory Visit: Payer: Self-pay

## 2019-07-03 ENCOUNTER — Ambulatory Visit: Payer: BLUE CROSS/BLUE SHIELD | Attending: Internal Medicine

## 2019-07-03 DIAGNOSIS — Z20822 Contact with and (suspected) exposure to covid-19: Secondary | ICD-10-CM

## 2019-07-04 LAB — NOVEL CORONAVIRUS, NAA: SARS-CoV-2, NAA: DETECTED — AB

## 2019-07-19 DIAGNOSIS — Z03818 Encounter for observation for suspected exposure to other biological agents ruled out: Secondary | ICD-10-CM | POA: Diagnosis not present

## 2019-07-19 DIAGNOSIS — M25532 Pain in left wrist: Secondary | ICD-10-CM | POA: Diagnosis not present

## 2019-07-20 DIAGNOSIS — M25531 Pain in right wrist: Secondary | ICD-10-CM | POA: Diagnosis not present

## 2019-07-20 DIAGNOSIS — M25532 Pain in left wrist: Secondary | ICD-10-CM | POA: Diagnosis not present

## 2019-07-23 DIAGNOSIS — U071 COVID-19: Secondary | ICD-10-CM | POA: Diagnosis not present

## 2019-07-23 DIAGNOSIS — R0602 Shortness of breath: Secondary | ICD-10-CM | POA: Diagnosis not present

## 2019-07-23 DIAGNOSIS — J3489 Other specified disorders of nose and nasal sinuses: Secondary | ICD-10-CM | POA: Diagnosis not present

## 2019-07-23 DIAGNOSIS — F430 Acute stress reaction: Secondary | ICD-10-CM | POA: Diagnosis not present

## 2019-07-25 DIAGNOSIS — Z03818 Encounter for observation for suspected exposure to other biological agents ruled out: Secondary | ICD-10-CM | POA: Diagnosis not present

## 2019-09-10 DIAGNOSIS — U071 COVID-19: Secondary | ICD-10-CM | POA: Diagnosis not present

## 2019-09-10 DIAGNOSIS — F430 Acute stress reaction: Secondary | ICD-10-CM | POA: Diagnosis not present

## 2019-10-12 DIAGNOSIS — Z0000 Encounter for general adult medical examination without abnormal findings: Secondary | ICD-10-CM | POA: Diagnosis not present

## 2019-10-12 DIAGNOSIS — Z79899 Other long term (current) drug therapy: Secondary | ICD-10-CM | POA: Diagnosis not present

## 2019-10-12 DIAGNOSIS — R739 Hyperglycemia, unspecified: Secondary | ICD-10-CM | POA: Diagnosis not present

## 2019-10-12 DIAGNOSIS — Z125 Encounter for screening for malignant neoplasm of prostate: Secondary | ICD-10-CM | POA: Diagnosis not present

## 2019-10-19 DIAGNOSIS — Z0000 Encounter for general adult medical examination without abnormal findings: Secondary | ICD-10-CM | POA: Diagnosis not present

## 2019-10-19 DIAGNOSIS — J302 Other seasonal allergic rhinitis: Secondary | ICD-10-CM | POA: Diagnosis not present

## 2019-10-19 DIAGNOSIS — F430 Acute stress reaction: Secondary | ICD-10-CM | POA: Diagnosis not present

## 2019-10-19 DIAGNOSIS — Z8616 Personal history of COVID-19: Secondary | ICD-10-CM | POA: Diagnosis not present

## 2019-10-19 DIAGNOSIS — R945 Abnormal results of liver function studies: Secondary | ICD-10-CM | POA: Diagnosis not present

## 2019-12-31 DIAGNOSIS — M25561 Pain in right knee: Secondary | ICD-10-CM | POA: Diagnosis not present

## 2019-12-31 DIAGNOSIS — M25461 Effusion, right knee: Secondary | ICD-10-CM | POA: Diagnosis not present

## 2019-12-31 DIAGNOSIS — S8001XA Contusion of right knee, initial encounter: Secondary | ICD-10-CM | POA: Diagnosis not present

## 2020-01-28 DIAGNOSIS — Z20828 Contact with and (suspected) exposure to other viral communicable diseases: Secondary | ICD-10-CM | POA: Diagnosis not present

## 2020-02-20 DIAGNOSIS — M25571 Pain in right ankle and joints of right foot: Secondary | ICD-10-CM | POA: Diagnosis not present

## 2020-02-25 DIAGNOSIS — L814 Other melanin hyperpigmentation: Secondary | ICD-10-CM | POA: Diagnosis not present

## 2020-02-25 DIAGNOSIS — L578 Other skin changes due to chronic exposure to nonionizing radiation: Secondary | ICD-10-CM | POA: Diagnosis not present

## 2020-02-25 DIAGNOSIS — Z86018 Personal history of other benign neoplasm: Secondary | ICD-10-CM | POA: Diagnosis not present

## 2020-02-25 DIAGNOSIS — D225 Melanocytic nevi of trunk: Secondary | ICD-10-CM | POA: Diagnosis not present

## 2020-02-27 DIAGNOSIS — M25571 Pain in right ankle and joints of right foot: Secondary | ICD-10-CM | POA: Diagnosis not present

## 2020-03-18 ENCOUNTER — Ambulatory Visit (INDEPENDENT_AMBULATORY_CARE_PROVIDER_SITE_OTHER): Payer: BC Managed Care – PPO | Admitting: Nurse Practitioner

## 2020-03-18 VITALS — BP 138/90 | HR 82 | Temp 97.3°F | Ht 72.0 in | Wt 214.0 lb

## 2020-03-18 DIAGNOSIS — Z91013 Allergy to seafood: Secondary | ICD-10-CM

## 2020-03-18 DIAGNOSIS — M2550 Pain in unspecified joint: Secondary | ICD-10-CM | POA: Diagnosis not present

## 2020-03-18 DIAGNOSIS — M2540 Effusion, unspecified joint: Secondary | ICD-10-CM

## 2020-03-18 DIAGNOSIS — F411 Generalized anxiety disorder: Secondary | ICD-10-CM

## 2020-03-18 DIAGNOSIS — R439 Unspecified disturbances of smell and taste: Secondary | ICD-10-CM

## 2020-03-18 MED ORDER — DULOXETINE HCL 30 MG PO CPEP: 30.0000 mg | ORAL_CAPSULE | Freq: Every day | ORAL | 0 refills | Status: DC

## 2020-03-18 NOTE — Assessment & Plan Note (Signed)
Multiple joint pain Joint swelling:  Will check labs - concerned for possible rheumatoid arthritis  Stay active  Stay well hydrated  Anxiety:  Anxiety has worsened since having covid.  Will order Cymbalta - please stop or call with any concerns  May continue xanax through PCP   Olfactory impairment:  Will place referral to neurology  Has tried essential oils without relief  New onset shellfish allergy:  Will place referral to allergy specialist    Follow up:  Follow up in 1 month for anxiety / starting Cymbalta

## 2020-03-18 NOTE — Patient Instructions (Addendum)
History of Covid Multiple joint pain Joint swelling:  Will check labs  Stay active  Stay well hydrated  Anxiety:  Will order Cymbalta - please stop or call with any concerns  May continue xanax through PCP   Olfactory impairment:  Will place referral to neurology  Has tried essential oils without relief  New onset shellfish allergy:  Will place referral to allergy specialist    Follow up:  Follow up in 1 month for anxiety / starting Cymbalta

## 2020-03-18 NOTE — Progress Notes (Signed)
@Patient  ID: III, male    DOB: 1975/07/12, 44 y.o.   MRN: [id number]  Chief Complaint  Patient presents with  . New Patient (Initial Visit)    COVID 06/2019 Sx; Smells smoke, joint pain, fatigue.     Referring provider: 07/2019, MD   44 year old male former smoker with history of ADD and anxiety.  Diagnosed with COVID January 2021.  HPI  Patient presents today for post COVID care clinic visit.  Patient states that he was diagnosed with Covid in January 2021.  Since that time he has been having ongoing altered taste and smell, anxiety, multiple joint pain and swelling.  Patient was evaluated by Ortho for right ankle swelling and left wrist swelling.  He states that they did draw fluid to evaluate for gout.  He states this come back negative.  Patient does have a history of anxiety prior to getting Covid.  He states that his anxiety has been worse since Covid.  He does see a psychiatrist.  He is currently taking Xanax as needed and that is prescribed by his PCP.  He states that he continually smells smoke or a rotting smell.  He states that his taste is still off.  Patient states that he has tried essential oils with no relief noted.  Patient are also reports having a recent allergic reaction to shellfish.  He states that he noted tongue swelling after eating shellfish.  He states that he did not go to the doctor for this and self-medicating with Benadryl.  Patient does have a primary care physician who has done recent lab work that was all within normal range.Denies f/c/s, n/v/d, hemoptysis, PND, chest pain or edema.      Allergies  Allergen Reactions  . Shrimp [Shellfish Allergy]     Immunization History  Administered Date(s) Administered  . Tdap 03/26/2012    Past Medical History:  Diagnosis Date  . Hypertension     Tobacco History: Social History   Tobacco Use  Smoking Status Never Smoker  Smokeless Tobacco Never Used   Counseling given:  Yes   Outpatient Encounter Medications as of 03/18/2020  Medication Sig  . ibuprofen (ADVIL,MOTRIN) 200 MG tablet Take 400 mg by mouth every 6 (six) hours as needed. For pain  . chlorthalidone (HYGROTON) 25 MG tablet TAKE 1 TABLET BY MOUTH EVERY DAY (Patient not taking: Reported on 03/18/2020)  . DULoxetine (CYMBALTA) 30 MG capsule Take 1 capsule (30 mg total) by mouth daily.  . sildenafil (VIAGRA) 100 MG tablet Take 100 mg by mouth as needed.  . verapamil (CALAN-SR) 180 MG CR tablet TAKE 2 TABLETS BY MOUTH AT BEDTIME (FUTURE REFILLS CALL [phone] TO MAKE OFFICE VISIT) (Patient not taking: Reported on 03/18/2020)   No facility-administered encounter medications on file as of 03/18/2020.     Review of Systems  Review of Systems  Constitutional: Negative.  Negative for fatigue and fever.  HENT: Negative.   Respiratory: Negative for cough and shortness of breath.   Cardiovascular: Negative.  Negative for chest pain, palpitations and leg swelling.  Gastrointestinal: Negative.   Musculoskeletal: Positive for arthralgias, joint swelling and myalgias.  Allergic/Immunologic: Negative.   Neurological: Negative.        Altered taste and smell.   Psychiatric/Behavioral: Negative.        Physical Exam  BP 138/90 (BP Location: Right Arm)   Pulse 82   Temp (!) 97.3 F (36.3 C)   Ht 6' (1.829 m)   05/18/2020  214 lb 0.1 oz (97.1 kg)   SpO2 98%   BMI 29.02 kg/m   Wt Readings from Last 5 Encounters:  03/18/20 214 lb 0.1 oz (97.1 kg)  06/15/17 225 lb 12.8 oz (102.4 kg)  04/12/17 214 lb 4 oz (97.2 kg)  02/10/17 208 lb 8 oz (94.6 kg)     Physical Exam Vitals and nursing note reviewed.  Constitutional:      General: He is not in acute distress.    Appearance: He is well-developed.  Cardiovascular:     Rate and Rhythm: Normal rate and regular rhythm.  Pulmonary:     Effort: Pulmonary effort is normal.     Breath sounds: Normal breath sounds.  Musculoskeletal:     Right lower leg: No  edema.     Left lower leg: No edema.  Skin:    General: Skin is warm and dry.  Neurological:     Mental Status: He is alert and oriented to person, place, and time.  Psychiatric:        Mood and Affect: Mood normal.        Behavior: Behavior normal.        Assessment & Plan:   Shellfish allergy Multiple joint pain Joint swelling:  Will check labs - concerned for possible rheumatoid arthritis  Stay active  Stay well hydrated  Anxiety:  Anxiety has worsened since having covid.  Will order Cymbalta - please stop or call with any concerns  May continue xanax through PCP   Olfactory impairment:  Will place referral to neurology  Has tried essential oils without relief  New onset shellfish allergy:  Will place referral to allergy specialist    Follow up:  Follow up in 1 month for anxiety / starting Cymbalta      Ivonne Andrew, NP 03/18/2020

## 2020-03-19 ENCOUNTER — Other Ambulatory Visit: Payer: Self-pay | Admitting: Nurse Practitioner

## 2020-03-19 DIAGNOSIS — Z8616 Personal history of COVID-19: Secondary | ICD-10-CM

## 2020-03-19 DIAGNOSIS — M2550 Pain in unspecified joint: Secondary | ICD-10-CM

## 2020-03-19 LAB — SEDIMENTATION RATE: Sed Rate: 13 mm/hr (ref 0–15)

## 2020-03-19 LAB — ANA: Anti Nuclear Antibody (ANA): NEGATIVE

## 2020-03-19 LAB — RHEUMATOID FACTOR: Rheumatoid fact SerPl-aCnc: <10 IU/mL (ref 0.0–13.9)

## 2020-03-19 LAB — C-REACTIVE PROTEIN: CRP: 1 mg/L (ref 0–10)

## 2020-03-20 DIAGNOSIS — M25461 Effusion, right knee: Secondary | ICD-10-CM | POA: Diagnosis not present

## 2020-03-31 ENCOUNTER — Encounter: Payer: Self-pay | Admitting: Physical Medicine & Rehabilitation

## 2020-04-04 DIAGNOSIS — M25461 Effusion, right knee: Secondary | ICD-10-CM | POA: Diagnosis not present

## 2020-04-11 ENCOUNTER — Other Ambulatory Visit: Payer: Self-pay | Admitting: Nurse Practitioner

## 2020-04-11 DIAGNOSIS — F411 Generalized anxiety disorder: Secondary | ICD-10-CM

## 2020-04-11 MED ORDER — DULOXETINE HCL 30 MG PO CPEP: 30.0000 mg | ORAL_CAPSULE | Freq: Every day | ORAL | 0 refills | Status: DC

## 2020-04-14 DIAGNOSIS — J3089 Other allergic rhinitis: Secondary | ICD-10-CM | POA: Diagnosis not present

## 2020-04-14 DIAGNOSIS — T781XXD Other adverse food reactions, not elsewhere classified, subsequent encounter: Secondary | ICD-10-CM | POA: Diagnosis not present

## 2020-04-14 DIAGNOSIS — J3081 Allergic rhinitis due to animal (cat) (dog) hair and dander: Secondary | ICD-10-CM | POA: Diagnosis not present

## 2020-04-14 DIAGNOSIS — J301 Allergic rhinitis due to pollen: Secondary | ICD-10-CM | POA: Diagnosis not present

## 2020-04-15 ENCOUNTER — Encounter: Payer: BC Managed Care – PPO | Admitting: Physical Medicine & Rehabilitation

## 2020-04-18 ENCOUNTER — Ambulatory Visit: Payer: BC Managed Care – PPO

## 2020-04-30 ENCOUNTER — Telehealth: Payer: Self-pay | Admitting: Internal Medicine

## 2020-04-30 NOTE — Telephone Encounter (Signed)
-----   Message from Ivonne Andrew, NP sent at 04/30/2020  8:07 AM EST ----- Regarding: RE: Medication Dosage This patient was supposed to have had a 1 month follow up scheduled to discuss this. I always follow up with patients 1 month after starting any anxiety medications. Orlene Plum could you schedule him for an appointment. It can be tele-visit if he can not come in. Thanks.  Archie Patten ----- Message ----- From: Eduard Clos Sent: 04/29/2020   2:29 PM EST To: Ivonne Andrew, NP Subject: Medication Dosage                              Patient requesting call back. Wants to speak to you about increasing the dose of his Cymbalta.

## 2020-04-30 NOTE — Telephone Encounter (Signed)
Pt was called to schedule appt. lvm to return call

## 2020-05-13 ENCOUNTER — Ambulatory Visit: Payer: BC Managed Care – PPO | Admitting: Allergy and Immunology

## 2020-05-21 ENCOUNTER — Other Ambulatory Visit: Payer: Self-pay

## 2020-05-21 ENCOUNTER — Ambulatory Visit (INDEPENDENT_AMBULATORY_CARE_PROVIDER_SITE_OTHER): Payer: Managed Care, Other (non HMO) | Admitting: Neurology

## 2020-05-21 ENCOUNTER — Other Ambulatory Visit: Payer: Self-pay | Admitting: Neurology

## 2020-05-21 ENCOUNTER — Encounter: Payer: Self-pay | Admitting: Neurology

## 2020-05-21 VITALS — BP 156/100 | HR 85 | Ht 72.0 in | Wt 219.0 lb

## 2020-05-21 DIAGNOSIS — R431 Parosmia: Secondary | ICD-10-CM | POA: Insufficient documentation

## 2020-05-21 DIAGNOSIS — R432 Parageusia: Secondary | ICD-10-CM | POA: Diagnosis not present

## 2020-05-21 DIAGNOSIS — R430 Anosmia: Secondary | ICD-10-CM | POA: Insufficient documentation

## 2020-05-21 MED ORDER — CARBAMAZEPINE ER 100 MG PO TB12
ORAL_TABLET | ORAL | 5 refills | Status: DC
Start: 2020-05-21 — End: 2020-05-21

## 2020-05-21 MED ORDER — CARBAMAZEPINE ER 100 MG PO TB12
ORAL_TABLET | ORAL | 5 refills | Status: DC
Start: 2020-05-21 — End: 2024-03-13

## 2020-05-21 NOTE — Progress Notes (Signed)
Provider:  Melvyn Novas, M D  Referring Provider: Creola Corn, MD Primary Care Physician:  Creola Corn, MD  Chief Complaint  Patient presents with  . New Patient (Initial Visit)    pt alone, rm 11. pt states that he had covid jan 2021. lost taste and smell. taste still off but mostly concern is certain smells have not recovered. it has been different things that can trigger.     HPI:  Jeffery Morgan is a 44 y.o. male and seen here on 05-21-2020 for Anosmia, referred by NP Angus Seller.  Jeffery Morgan reports that he was diagnosed with Covid on 07/03/2019 and that his family contracted the disease as well but he was the only one symptomatic.  His oldest child actually did never develop antibodies and never got the infection.  He lost taste and smell, and his taste is still somewhat off but his main concern is that he has noted a persistent inability to distinguish and differentiate certain smells and scents.  At times he smell something that is not truly there or it has a misperception of an unpleasant smell associated with something that was usually perceived as pleasant.  He reports that his daughter's fruity shampoos are to him musty and moldy. Also reports an olfactory sensation of rotten food or burning / smoke scents.  He seems not to be able to distinguish smoke.   He has started using essential oils at home but has not had recovery/ success - used these off and on for 2 month.   Orange double failure. No scent no taste. Peppermint- 'cleaning product' , double failure.  Coconut- no smell no taste, thought of vanilla.  Vanilla - identified by scent . Not taste.  Rum- unsure, something sweet.   Has a lot of parosmia and severe los of smell and taste.       Review of Systems: Out of a complete 14 system review, the patient complains of only the following symptoms, and all other reviewed systems are negative. See above   Social History   Socioeconomic History  .  Marital status: Married    Spouse name: Not on file  . Number of children: Not on file  . Years of education: Not on file  . Highest education level: Not on file  Occupational History  . Not on file  Tobacco Use  . Smoking status: Never Smoker  . Smokeless tobacco: Never Used  Substance and Sexual Activity  . Alcohol use: Yes    Comment: occ  . Drug use: No  . Sexual activity: Yes  Other Topics Concern  . Not on file  Social History Narrative  . Not on file   Social Determinants of Health   Financial Resource Strain:   . Difficulty of Paying Living Expenses: Not on file  Food Insecurity:   . Worried About Programme researcher, broadcasting/film/video in the Last Year: Not on file  . Ran Out of Food in the Last Year: Not on file  Transportation Needs:   . Lack of Transportation (Medical): Not on file  . Lack of Transportation (Non-Medical): Not on file  Physical Activity:   . Days of Exercise per Week: Not on file  . Minutes of Exercise per Session: Not on file  Stress:   . Feeling of Stress : Not on file  Social Connections:   . Frequency of Communication with Friends and Family: Not on file  . Frequency of Social Gatherings with Friends  and Family: Not on file  . Attends Religious Services: Not on file  . Active Member of Clubs or Organizations: Not on file  . Attends Banker Meetings: Not on file  . Marital Status: Not on file  Intimate Partner Violence:   . Fear of Current or Ex-Partner: Not on file  . Emotionally Abused: Not on file  . Physically Abused: Not on file  . Sexually Abused: Not on file    Family History  Problem Relation Age of Onset  . Stroke Mother   . Heart disease Maternal Grandfather     Past Medical History:  Diagnosis Date  . Hypertension     Past Surgical History:  Procedure Laterality Date  . JOINT REPLACEMENT      Current Outpatient Medications  Medication Sig Dispense Refill  . DULoxetine (CYMBALTA) 20 MG capsule Take 20 mg by mouth 2  (two) times daily.    Marland Kitchen ibuprofen (ADVIL,MOTRIN) 200 MG tablet Take 400 mg by mouth every 6 (six) hours as needed. For pain    . sildenafil (VIAGRA) 100 MG tablet Take 100 mg by mouth as needed.     No current facility-administered medications for this visit.    Allergies as of 05/21/2020 - Review Complete 05/21/2020  Allergen Reaction Noted  . Shrimp [shellfish allergy]  03/18/2020    Vitals: BP (!) 156/100   Pulse 85   Ht 6' (1.829 m)   Wt 219 lb (99.3 kg)   BMI 29.70 kg/m  Last Weight:  Wt Readings from Last 1 Encounters:  05/21/20 219 lb (99.3 kg)   Last Height:   Ht Readings from Last 1 Encounters:  05/21/20 6' (1.829 m)    Physical exam:  General: The patient is awake, alert and appears not in acute distress.  The patient is well groomed. Head: Normocephalic, atraumatic. Neck is supple.  Cardiovascular:  Regular rate and rhythm , without  murmurs or carotid bruit, and without distended neck veins. Respiratory: Lungs are clear to auscultation. Skin:  Without evidence of edema, or rash Trunk: BMI is 29 and patient  has normal posture.  Neurologic exam : The patient is awake and alert, oriented to place and time.  Memory subjective described as intact. There is a normal attention span & concentration ability. Speech is fluent without  dysarthria, dysphonia or aphasia. Mood and affect are appropriate.  Cranial nerves: Pupils are equal and briskly reactive to light. Extraocular movements  in vertical and horizontal planes intact and without nystagmus.  Visual fields by finger perimetry are intact. Hearing to finger rub intact.  Facial sensation intact to fine touch. Facial motor strength is symmetric and tongue and uvula move midline. Tongue protrusion into either cheek is normal. Shoulder shrug is normal.   Motor exam:  Normal tone ,muscle bulk and symmetric strength in all extremities. He reports myalgia and  joint pain.   Sensory:  Fine touch, pinprick and  vibration were  normal.  Coordination: Rapid alternating movements in the fingers/hands were normal.  Gait and station: Patient walks without assistive device  Deep tendon reflexes: in the upper and lower extremities are symmetric and intact.   Assessment:  After physical and neurologic examination, review of laboratory studies, imaging, neurophysiology testing and pre-existing records, assessment is that of :   Jeffery Morgan has been unable to identify 3 categories of major scent families and he had also trouble to identify them by taste.  This is ageusia and anosmia this parosmia describing the perception  of unpleasant smells mainly a form of olfactory misperception sometimes based in a sentence that is perceived as something else and sometimes out of the blue without any trigger identifiable by others.  Plan:  Treatment plan and additional workup :  Continue visual and olfactory association training.  Start tegretol for parosmia, 100 mg at night, can increase to bid if tolerated.  Theophylline nasal spray compounded by combining saline nasal spray 30 ml with 20 mg of theophylline. Use twice daily in each nostril.   Keep a journal. Rv in 3 month , can be virtual .   Melvyn Novas MD 05/21/2020   Loss of smell, also known as ANOSMIA, is one of the most common symptoms of COVID-19.   [facility] Professor of Otolaryngology Loetta Rough, MD, and Assistant Professor Monika Salk, MD, have investigated several treatments for persistent anosmia (loss of smell), with a special interest in viral-related anosmia.     As the number of total, confirmed COVID-19 cases increases so does the number of people suffering from disease-related anosmia, making anosmia a significant public health problem.     Rehabilitation of anosmia :   Smell daily one sample of the following categories; and look at a picture of the object - f. Example at a picture of a lemon while smelling a  lemon oil.   A floral scent  - such as rose, lavender, or vanilla - all would qualify for these.  A spice scent - nutmeg, anise, coffee, cinnamon-  A citrus smell - lemon, lime, orange  A menthol or minty scent, or eucalyptus.   Goal is to re-associate scent ( and eventually taste ) to the image.  The success is gradual, and this form of rehabilitation may take 12 month to succeed.   Another complication is PAROSMIA - smelling something that is not present, often an unpleasant smell of burning rubber or spoiled , foul smells.   This can be reduced by using a carbamazepine at 100 mg po bid. This antiepileptic medication slows the nerve conduction and allows the reduction in abnormal smell sensation.    Melvyn Novas, MD

## 2020-05-21 NOTE — Patient Instructions (Signed)
Loss of smell, also known as ANOSMIA, is one of the most common symptoms of COVID-19.   [facility] Professor of Otolaryngology Loetta Rough, MD, and Assistant Professor Monika Salk, MD, have investigated several treatments for persistent anosmia (loss of smell), with a special interest in viral-related anosmia.     As the number of total, confirmed COVID-19 cases increases so does the number of people suffering from disease-related anosmia, making anosmia a significant public health problem.     Rehabilitation of anosmia :   Smell daily one sample of the following categories; and look at a picture of the object - f. Example at a picture of a lemon while smelling a lemon oil.   A floral scent  - such as rose, lavender, or vanilla - all would qualify for these.  A spice scent - nutmeg, anise, coffee, cinnamon-  A citrus smell - lemon, lime, orange  A menthol or minty scent, or eucalyptus.   Goal is to re-associate scent ( and eventually taste ) to the image.  The success is gradual, and this form of rehabilitation may take 12 month to succeed.   Another complication is PAROSMIA - smelling something that is not present, often an unpleasant smell of burning rubber or spoiled , foul smells.   This can be reduced by using a carbamazepine at 100 mg po bid. This antiepileptic medication slows the nerve conduction and allows the reduction in abnormal smell sensation.    Melvyn Novas, MD        Treatment of ANOSMIA and Ageusia:  If the combination of smell and vision of the same object that you're smelling improves the recovery of smell, then just smell alone. So we call that bimodal, visual and olfactory stimulation. So the standard olfactory training is four scents: rose, lemon, cloves and eucalyptus.  And the subject sniffs twice a day, 10, 12, 20 seconds each time the four essential oils for anywhere to six, eight or 12 weeks.  And olfactory training for anosmia  has been around for maybe 10, 15 years, studies suggest it works.  There's nothing absolutely definitive- but it can't hurt. It's thought to work by retraining the brain, the process of neuroplasticity, retraining the brain to smell these odors again. And we think that by smelling, you can retrain the brain. Now, one of the outstanding questions is, are you retraining the brain to smell rose, lemon, cloves and eucalyptus better, but forget about coffee, vanilla, coconut?  How generalizable is the improvement in smell? Rosanne Ashing, we asked the patients what smells would you like to pick and train on? The answer was fascinating to [place]. I thought it might be coffee as a coffee lover. I thought it might be vanilla as a vanilla-bean ice cream lover. It was smoke. The number one odor, smell, if you will, that people wanted to train on and to get better was smoke for the reasons that we talked about before.  The sense of smell is first and foremost attached to safety- these people felt that if they could smell smoke better, they would feel more safe.  Unfortunately, there isn't a smoke essential oil so we couldn't do that.  But it just reinforced to [place] how important it is for patients to pick what smells they want to train on.  The other unique aspect that should be brought up is that half of the group are looking at high-quality pictures of the item, one of four items they picked, and  each one of them have high-quality photographs presented on their iPhone or iPad at the same time that they smell.

## 2020-05-22 ENCOUNTER — Telehealth: Payer: Self-pay | Admitting: Neurology

## 2020-05-22 LAB — COMPREHENSIVE METABOLIC PANEL
ALT: 31 IU/L (ref 0–44)
AST: 24 IU/L (ref 0–40)
Albumin/Globulin Ratio: 2.2 (ref 1.2–2.2)
Albumin: 4.7 g/dL (ref 4.0–5.0)
Alkaline Phosphatase: 56 IU/L (ref 44–121)
BUN/Creatinine Ratio: 17 (ref 9–20)
BUN: 16 mg/dL (ref 6–24)
Bilirubin Total: 0.4 mg/dL (ref 0.0–1.2)
CO2: 23 mmol/L (ref 20–29)
Calcium: 9.5 mg/dL (ref 8.7–10.2)
Chloride: 101 mmol/L (ref 96–106)
Creatinine, Ser: 0.96 mg/dL (ref 0.76–1.27)
GFR calc Af Amer: 112 mL/min/{1.73_m2} (ref >59)
GFR calc non Af Amer: 97 mL/min/{1.73_m2} (ref >59)
Globulin, Total: 2.1 g/dL (ref 1.5–4.5)
Glucose: 95 mg/dL (ref 65–99)
Potassium: 4.5 mmol/L (ref 3.5–5.2)
Sodium: 140 mmol/L (ref 134–144)
Total Protein: 6.8 g/dL (ref 6.0–8.5)

## 2020-05-22 LAB — CBC WITH DIFFERENTIAL/PLATELET
Basophils Absolute: 0.1 10*3/uL (ref 0.0–0.2)
Basos: 1 %
EOS (ABSOLUTE): 0.1 10*3/uL (ref 0.0–0.4)
Eos: 1 %
Hematocrit: 48.6 % (ref 37.5–51.0)
Hemoglobin: 17.3 g/dL (ref 13.0–17.7)
Immature Grans (Abs): 0 10*3/uL (ref 0.0–0.1)
Immature Granulocytes: 1 %
Lymphocytes Absolute: 2 10*3/uL (ref 0.7–3.1)
Lymphs: 33 %
MCH: 32.5 pg (ref 26.6–33.0)
MCHC: 35.6 g/dL (ref 31.5–35.7)
MCV: 91 fL (ref 79–97)
Monocytes Absolute: 0.9 10*3/uL (ref 0.1–0.9)
Monocytes: 14 %
Neutrophils Absolute: 3.2 10*3/uL (ref 1.4–7.0)
Neutrophils: 50 %
Platelets: 216 10*3/uL (ref 150–450)
RBC: 5.33 x10E6/uL (ref 4.14–5.80)
RDW: 11.7 % (ref 11.6–15.4)
WBC: 6.2 10*3/uL (ref 3.4–10.8)

## 2020-05-22 NOTE — Progress Notes (Signed)
All normal metabolic panel - normal CBC- you may start on Carbamazepine.

## 2020-05-22 NOTE — Telephone Encounter (Signed)
Called the pt and reviewed the normal lab results. Advised the pt to start the medication. Pt verbalized understanding. Pt had no questions at this time but was encouraged to call back if questions arise.

## 2020-05-22 NOTE — Telephone Encounter (Signed)
-----   Message from Melvyn Novas, MD sent at 05/22/2020  3:49 PM EST ----- All normal metabolic panel - normal CBC- you may start on Carbamazepine.

## 2020-06-19 ENCOUNTER — Ambulatory Visit: Payer: BC Managed Care – PPO | Admitting: Neurology

## 2020-09-02 ENCOUNTER — Telehealth (INDEPENDENT_AMBULATORY_CARE_PROVIDER_SITE_OTHER): Payer: Managed Care, Other (non HMO) | Admitting: Adult Health

## 2020-09-02 DIAGNOSIS — R430 Anosmia: Secondary | ICD-10-CM

## 2020-09-02 DIAGNOSIS — R432 Parageusia: Secondary | ICD-10-CM

## 2020-09-02 DIAGNOSIS — R431 Parosmia: Secondary | ICD-10-CM | POA: Diagnosis not present

## 2020-09-02 NOTE — Progress Notes (Signed)
PATIENT: Jeffery Morgan DOB: 1976/02/10  REASON FOR VISIT: follow up HISTORY FROM: patient  Virtual Visit via Video Note  I connected with Jeffery Morgan on 09/02/20 at  3:00 PM EDT by a video enabled telemedicine application located remotely at [facility] and verified that I am speaking with the correct person using two identifiers who was located at their own home.   I discussed the limitations of evaluation and management by telemedicine and the availability of in person appointments. The patient expressed understanding and agreed to proceed.   PATIENT: Jeffery Morgan DOB: Dec 06, 1975  REASON FOR VISIT: follow up HISTORY FROM: patient  HISTORY OF PRESENT ILLNESS: Today 09/02/20:  Mr. Carithers is a 46 year old male with a history of loss of taste and smell after Covid infection in addition to parasomnia.  At his last visit with Dr. Vickey Huger he was started on carbamazepine for parasomnia.  He reports that he is no longer smelling smoke or rotten food.  Unfortunately reports that he still cannot smell or taste a lot of things.  He has continued to do smell/taste training with essential oils.  HISTORY (Copied from Dr.Dohmeier's note) Jeffery Morgan is a 45 y.o. male and seen here on 05-21-2020 for Anosmia, referred by NP Angus Seller.  Mr. Bennie Hind reports that he was diagnosed with Covid on 07/03/2019 and that his family contracted the disease as well but he was the only one symptomatic.  His oldest child actually did never develop antibodies and never got the infection.  He lost taste and smell, and his taste is still somewhat off but his main concern is that he has noted a persistent inability to distinguish and differentiate certain smells and scents.  At times he smell something that is not truly there or it has a misperception of an unpleasant smell associated with something that was usually perceived as pleasant.  He reports that his  daughter's fruity shampoos are to him musty and moldy. Also reports an olfactory sensation of rotten food or burning / smoke scents.  He seems not to be able to distinguish smoke.   He has started using essential oils at home but has not had recovery/ success - used these off and on for 2 month.   Orange double failure. No scent no taste. Peppermint- 'cleaning product' , double failure.  Coconut- no smell no taste, thought of vanilla.  Vanilla - identified by scent . Not taste.  Rum- unsure, something sweet.   Has a lot of parosmia and severe los of smell and taste.   REVIEW OF SYSTEMS: Out of a complete 14 system review of symptoms, the patient complains only of the following symptoms, and all other reviewed systems are negative.  See HPI  ALLERGIES: Allergies  Allergen Reactions  . Shrimp [Shellfish Allergy]     HOME MEDICATIONS: Outpatient Medications Prior to Visit  Medication Sig Dispense Refill  . carbamazepine (TEGRETOL XR) 100 MG 12 hr tablet Take one in AM po for 14 days then , if tolerated, increase to bid. 60 tablet 5  . DULoxetine (CYMBALTA) 20 MG capsule Take 20 mg by mouth 2 (two) times daily.    Marland Kitchen ibuprofen (ADVIL,MOTRIN) 200 MG tablet Take 400 mg by mouth every 6 (six) hours as needed. For pain    . sildenafil (VIAGRA) 100 MG tablet Take 100 mg by mouth as needed.     No facility-administered medications prior to visit.    PAST MEDICAL HISTORY: Past  Medical History:  Diagnosis Date  . Hypertension     PAST SURGICAL HISTORY: Past Surgical History:  Procedure Laterality Date  . JOINT REPLACEMENT      FAMILY HISTORY: Family History  Problem Relation Age of Onset  . Stroke Mother   . Heart disease Maternal Grandfather     SOCIAL HISTORY: Social History   Socioeconomic History  . Marital status: Married    Spouse name: Not on file  . Number of children: Not on file  . Years of education: Not on file  . Highest education level: Not on file   Occupational History  . Not on file  Tobacco Use  . Smoking status: Never Smoker  . Smokeless tobacco: Never Used  Substance and Sexual Activity  . Alcohol use: Yes    Comment: occ  . Drug use: No  . Sexual activity: Yes  Other Topics Concern  . Not on file  Social History Narrative  . Not on file   Social Determinants of Health   Financial Resource Strain: Not on file  Food Insecurity: Not on file  Transportation Needs: Not on file  Physical Activity: Not on file  Stress: Not on file  Social Connections: Not on file  Intimate Partner Violence: Not on file      PHYSICAL EXAM Generalized: Well developed, in no acute distress   Neurological examination  Mentation: Alert oriented to time, place, history taking. Follows all commands speech and language fluent Cranial nerve II-XII:Extraocular movements were full. Facial symmetry noted. uvula tongue midline. Head turning and shoulder shrug  were normal and symmetric. Motor: Good strength throughout subjectively per patient Sensory: Sensory testing is intact to soft touch on all 4 extremities subjectively per patient Coordination: Cerebellar testing reveals good finger-nose-finger  Gait and station: Patient is able to stand from a seated position. gait is normal.  Reflexes: UTA  DIAGNOSTIC DATA (LABS, IMAGING, TESTING) - I reviewed patient records, labs, notes, testing and imaging myself where available.  Lab Results  Component Value Date   WBC 6.2 05/21/2020   HGB 17.3 05/21/2020   HCT 48.6 05/21/2020   MCV 91 05/21/2020   PLT 216 05/21/2020      Component Value Date/Time   NA 140 05/21/2020 1436   K 4.5 05/21/2020 1436   CL 101 05/21/2020 1436   CO2 23 05/21/2020 1436   GLUCOSE 95 05/21/2020 1436   GLUCOSE 111 (H) 08/11/2017 0926   BUN 16 05/21/2020 1436   CREATININE 0.96 05/21/2020 1436   CALCIUM 9.5 05/21/2020 1436   PROT 6.8 05/21/2020 1436   ALBUMIN 4.7 05/21/2020 1436   AST 24 05/21/2020 1436   ALT  31 05/21/2020 1436   ALKPHOS 56 05/21/2020 1436   BILITOT 0.4 05/21/2020 1436   GFRNONAA 97 05/21/2020 1436   GFRAA 112 05/21/2020 1436   Lab Results  Component Value Date   CHOL 188 02/24/2017   HDL 46 02/24/2017   LDLCALC 106 (H) 02/24/2017   TRIG 180 (H) 02/24/2017   CHOLHDL 4.1 02/24/2017   Lab Results  Component Value Date   TSH 1.343 04/12/2017      ASSESSMENT AND PLAN 45 y.o. year old male  has a past medical history of Hypertension. here with:  1. Ageusia 2. Anosmia 3. Parosomia   -Advised that he can try to decrease carbamazepine XR to 1 tablet daily for 1 week and then stop the medication.  If he experiences parosomias that he will need to restart the medication -Advised that  they are doing a research trial at [facility] with Dr. Thomes Dinning regarding plasma rich therapy for loss of taste and smell.  Advised that it may be beneficial to get established with an ENT for possible research opportunities in the future.  Patient will discuss a referral with his PCP -Follow-up on an as-needed basis  I spent 20 minutes of face-to-face and non-face-to-face time with patient.  This included previsit chart review, lab review, study review, order entry, electronic health record documentation, patient education.    Butch Penny, MSN, NP-C 09/02/2020, 3:08 PM [facility] [address], [address] [place], [place] [address] [phone]

## 2022-12-07 ENCOUNTER — Encounter: Payer: Self-pay | Admitting: Gastroenterology

## 2023-01-17 ENCOUNTER — Encounter: Payer: Self-pay | Admitting: Gastroenterology

## 2023-01-17 ENCOUNTER — Ambulatory Visit (AMBULATORY_SURGERY_CENTER): Payer: 59 | Admitting: *Deleted

## 2023-01-17 VITALS — Ht 72.0 in | Wt 220.0 lb

## 2023-01-17 DIAGNOSIS — Z1211 Encounter for screening for malignant neoplasm of colon: Secondary | ICD-10-CM

## 2023-01-17 MED ORDER — NA SULFATE-K SULFATE-MG SULF 17.5-3.13-1.6 GM/177ML PO SOLN: 1.0000 | Freq: Once | ORAL | 0 refills | Status: AC

## 2023-01-17 NOTE — Progress Notes (Unsigned)
Pt's previsit is done over the phone and all paperwork (prep instructions) sent to patient.  Pt's name and DOB verified at the beginning of the previsit.  Pt denies any difficulty with ambulating.    No egg or soy allergy known to patient  No issues known to pt with past sedation with any surgeries or procedures Patient denies trouble moving neck  No FH of Malignant Hyperthermia Pt is not on diet pills Pt is not on  home 02  Pt is not on blood thinners  Pt denies issues with constipation  Pt is not on dialysis Pt denies any upcoming cardiac testing Pt encouraged to use to use Singlecare or Goodrx to reduce cost  Patient's chart reviewed by John Nulty CNRA prior to previsit and patient appropriate for the [facility].  Previsit completed and red dot placed by patient's name on their procedure day (on provider's schedule).    

## 2023-01-27 ENCOUNTER — Encounter: Payer: BC Managed Care – PPO | Admitting: Gastroenterology

## 2023-02-04 ENCOUNTER — Telehealth: Payer: Self-pay | Admitting: Gastroenterology

## 2023-02-04 NOTE — Telephone Encounter (Signed)
Inbound call  from patient stating that he needed to reschedule his colonoscopy. Patient was rescheduled for 10/15 at 8:30 and is requesting a call back to discuss new times to start his prep. Please advise.

## 2023-02-04 NOTE — Telephone Encounter (Signed)
Attempt to reach pt. Jeffery Morgan letting pt know information he requested has been sent to my chart and via mail. Instructed to call back if has any questions.

## 2023-02-09 ENCOUNTER — Encounter: Payer: BC Managed Care – PPO | Admitting: Gastroenterology

## 2023-03-29 ENCOUNTER — Encounter: Payer: BC Managed Care – PPO | Admitting: Gastroenterology

## 2023-07-12 ENCOUNTER — Other Ambulatory Visit (HOSPITAL_BASED_OUTPATIENT_CLINIC_OR_DEPARTMENT_OTHER): Payer: Self-pay

## 2023-07-12 MED ORDER — OSELTAMIVIR PHOSPHATE 75 MG PO CAPS
75.0000 mg | ORAL_CAPSULE | Freq: Every day | ORAL | 0 refills | Status: AC
  Filled 2023-07-12: qty 10, 10d supply, fill #0

## 2024-01-04 ENCOUNTER — Encounter: Payer: Self-pay | Admitting: Gastroenterology

## 2024-02-28 ENCOUNTER — Encounter

## 2024-02-29 ENCOUNTER — Ambulatory Visit (AMBULATORY_SURGERY_CENTER)

## 2024-02-29 VITALS — Ht 72.0 in | Wt 220.0 lb

## 2024-02-29 DIAGNOSIS — Z1211 Encounter for screening for malignant neoplasm of colon: Secondary | ICD-10-CM

## 2024-02-29 MED ORDER — NA SULFATE-K SULFATE-MG SULF 17.5-3.13-1.6 GM/177ML PO SOLN
1.0000 | Freq: Once | ORAL | 0 refills | Status: AC
Start: 2024-02-29 — End: 2024-02-29

## 2024-02-29 NOTE — Progress Notes (Signed)

## 2024-03-02 ENCOUNTER — Encounter: Payer: Self-pay | Admitting: Gastroenterology

## 2024-03-13 ENCOUNTER — Encounter: Payer: Self-pay | Admitting: Gastroenterology

## 2024-03-13 ENCOUNTER — Ambulatory Visit: Admitting: Gastroenterology

## 2024-03-13 VITALS — BP 144/100 | HR 67 | Temp 97.8°F | Resp 20 | Ht 72.0 in | Wt 220.0 lb

## 2024-03-13 DIAGNOSIS — D123 Benign neoplasm of transverse colon: Secondary | ICD-10-CM

## 2024-03-13 DIAGNOSIS — D125 Benign neoplasm of sigmoid colon: Secondary | ICD-10-CM

## 2024-03-13 DIAGNOSIS — D127 Benign neoplasm of rectosigmoid junction: Secondary | ICD-10-CM | POA: Diagnosis not present

## 2024-03-13 DIAGNOSIS — Z1211 Encounter for screening for malignant neoplasm of colon: Secondary | ICD-10-CM | POA: Diagnosis present

## 2024-03-13 DIAGNOSIS — D128 Benign neoplasm of rectum: Secondary | ICD-10-CM | POA: Diagnosis not present

## 2024-03-13 MED ORDER — SODIUM CHLORIDE 0.9 % IV SOLN: 500.0000 mL | Freq: Once | INTRAVENOUS | Status: DC

## 2024-03-13 NOTE — Patient Instructions (Signed)
-  Handout on polyps provided -Await pathology results  YOU HAD AN ENDOSCOPIC PROCEDURE TODAY AT THE [facility]:   Refer to the procedure report that was given to you for any specific questions about what was found during the examination.  If the procedure report does not answer your questions, please call your gastroenterologist to clarify.  If you requested that your care partner not be given the details of your procedure findings, then the procedure report has been included in a sealed envelope for you to review at your convenience later.  YOU SHOULD EXPECT: Some feelings of bloating in the abdomen. Passage of more gas than usual.  Walking can help get rid of the air that was put into your GI tract during the procedure and reduce the bloating. If you had a lower endoscopy (such as a colonoscopy or flexible sigmoidoscopy) you may notice spotting of blood in your stool or on the toilet paper. If you underwent a bowel prep for your procedure, you may not have a normal bowel movement for a few days.  Please Note:  You might notice some irritation and congestion in your nose or some drainage.  This is from the oxygen used during your procedure.  There is no need for concern and it should clear up in a day or so.  SYMPTOMS TO REPORT IMMEDIATELY:  Following lower endoscopy (colonoscopy or flexible sigmoidoscopy):  Excessive amounts of blood in the stool  Significant tenderness or worsening of abdominal pains  Swelling of the abdomen that is new, acute  Fever of 100F or higher  For urgent or emergent issues, a gastroenterologist can be reached at any hour by calling [phone]. Do not use MyChart messaging for urgent concerns.    DIET:  We do recommend a small meal at first, but then you may proceed to your regular diet.  Drink plenty of fluids but you should avoid alcoholic beverages for 24 hours.  ACTIVITY:  You should plan to take it easy for the rest of today and you should NOT  DRIVE or use heavy machinery until tomorrow (because of the sedation medicines used during the test).    FOLLOW UP: Our staff will call the number listed on your records the next business day following your procedure.  We will call around 7:15- 8:00 am to check on you and address any questions or concerns that you may have regarding the information given to you following your procedure. If we do not reach you, we will leave a message.     If any biopsies were taken you will be contacted by phone or by letter within the next 1-3 weeks.  Please call us  at [phone] if you have not heard about the biopsies in 3 weeks.    SIGNATURES/CONFIDENTIALITY: You and/or your care partner have signed paperwork which will be entered into your electronic medical record.  These signatures attest to the fact that that the information above on your After Visit Summary has been reviewed and is understood.  Full responsibility of the confidentiality of this discharge information lies with you and/or your care-partner.

## 2024-03-13 NOTE — Progress Notes (Signed)
 [facility] History and Physical   Primary Care Physician:  Onita Rush, MD   Reason for Procedure:   Colon cancer screening  Plan:    Screening colonoscopy     HPI: Jeffery Morgan is a 48 y.o. male undergoing initial average risk screening colonoscopy.  He has no family history of colon cancer and no chronic GI symptoms.    Past Medical History:  Diagnosis Date   Hypertension     Past Surgical History:  Procedure Laterality Date   APPENDECTOMY  2001   JOINT REPLACEMENT Left    knee reconstruction    Prior to Admission medications   Medication Sig Start Date End Date Taking? Authorizing Provider  allopurinol (ZYLOPRIM) 100 MG tablet Take 100 mg by mouth daily. 01/13/24  Yes [provider]  albuterol (VENTOLIN HFA) 108 (90 Base) MCG/ACT inhaler Inhale 1 puff into the lungs. As needed    [provider]  azithromycin (ZITHROMAX) 250 MG tablet Take 250 mg by mouth as directed. 02/28/24   [provider]  benzonatate (TESSALON) 200 MG capsule Take 200 mg by mouth 3 (three) times daily as needed. 02/28/24   [provider]  carbamazepine  (TEGRETOL  XR) 100 MG 12 hr tablet Take one in AM po for 14 days then , if tolerated, increase to bid. 05/21/20   Dohmeier, Dedra, MD  DULoxetine  (CYMBALTA ) 20 MG capsule Take 20 mg by mouth 2 (two) times daily. 05/15/20   [provider]  ibuprofen (ADVIL,MOTRIN) 200 MG tablet Take 400 mg by mouth every 6 (six) hours as needed. For pain    [provider]  oseltamivir  (TAMIFLU ) 75 MG capsule Take 1 capsule (75 mg total) by mouth daily. 07/12/23     sildenafil (VIAGRA) 100 MG tablet Take 100 mg by mouth as needed. 10/19/19   [provider]    Current Outpatient Medications  Medication Sig Dispense Refill   allopurinol (ZYLOPRIM) 100 MG tablet Take 100 mg by mouth daily.     albuterol (VENTOLIN HFA) 108 (90 Base) MCG/ACT inhaler Inhale 1 puff into the lungs. As needed      azithromycin (ZITHROMAX) 250 MG tablet Take 250 mg by mouth as directed.     benzonatate (TESSALON) 200 MG capsule Take 200 mg by mouth 3 (three) times daily as needed.     carbamazepine  (TEGRETOL  XR) 100 MG 12 hr tablet Take one in AM po for 14 days then , if tolerated, increase to bid. 60 tablet 5   DULoxetine  (CYMBALTA ) 20 MG capsule Take 20 mg by mouth 2 (two) times daily.     ibuprofen (ADVIL,MOTRIN) 200 MG tablet Take 400 mg by mouth every 6 (six) hours as needed. For pain     oseltamivir  (TAMIFLU ) 75 MG capsule Take 1 capsule (75 mg total) by mouth daily. 10 capsule 0   sildenafil (VIAGRA) 100 MG tablet Take 100 mg by mouth as needed.     Current Facility-Administered Medications  Medication Dose Route Frequency Provider Last Rate Last Admin   0.9 %  sodium chloride infusion  500 mL Intravenous Once Morgan Glendia BRAVO, MD        Allergies as of 03/13/2024   (No Known Allergies)    Family History  Problem Relation Age of Onset   Stroke Mother    Heart disease Maternal Grandfather    Colon cancer Neg Hx    Esophageal cancer Neg Hx    Rectal cancer Neg Hx    Stomach cancer Neg  Hx     Social History   Socioeconomic History   Marital status: Married    Spouse name: Not on file   Number of children: Not on file   Years of education: Not on file   Highest education level: Not on file  Occupational History   Not on file  Tobacco Use   Smoking status: Never   Smokeless tobacco: Never  Vaping Use   Vaping status: Never Used  Substance and Sexual Activity   Alcohol use: Yes    Comment: occ   Drug use: No   Sexual activity: Yes  Other Topics Concern   Not on file  Social History Narrative   Not on file   Social Drivers of Health   Financial Resource Strain: Not on file  Food Insecurity: Not on file  Transportation Needs: Not on file  Physical Activity: Not on file  Stress: Not on file  Social Connections: Not on file  Intimate Partner Violence: Not on file     Review of Systems:  All other review of systems negative except as mentioned in the HPI.  Physical Exam: Vital signs BP (!) 156/102   Pulse 71   Temp 97.8 F (36.6 C) (Skin)   Resp 14   Ht 6' (1.829 m)   Wt 220 lb (99.8 kg)   SpO2 97%   BMI 29.84 kg/m   General:   Alert,  Well-developed, well-nourished, pleasant and cooperative in NAD Airway:  Mallampati 2 Lungs:  Clear throughout to auscultation.   Heart:  Regular rate and rhythm; no murmurs, clicks, rubs,  or gallops. Abdomen:  Soft, nontender and nondistended. Normal bowel sounds.   Neuro/Psych:  Normal mood and affect. A and O x 3   Jeffery Smyers E. Stacia, MD [facility]

## 2024-03-13 NOTE — Progress Notes (Addendum)
 Pt states no medical or surgical changes since previsit or office visit.   BP recheck 156/103. Pt denies chest pain or SOB.

## 2024-03-13 NOTE — Op Note (Signed)
 [facility] Patient Name: Jeffery Morgan Procedure Date: 03/13/2024 10:33 AM MRN: [id number] Endoscopist: Glendia E. Stacia , MD, [id number] Age: 48 Referring MD:  Date of Birth: [birth year] Gender: Male Account #: [id number] Procedure:                Colonoscopy Indications:              Screening for colorectal malignant neoplasm, This                            is the patient's first colonoscopy Medicines:                Monitored Anesthesia Care Procedure:                Pre-Anesthesia Assessment:                           - Prior to the procedure, a History and Physical                            was performed, and patient medications and                            allergies were reviewed. The patient's tolerance of                            previous anesthesia was also reviewed. The risks                            and benefits of the procedure and the sedation                            options and risks were discussed with the patient.                            All questions were answered, and informed consent                            was obtained. Prior Anticoagulants: The patient has                            taken no anticoagulant or antiplatelet agents. ASA                            Grade Assessment: II - A patient with mild systemic                            disease. After reviewing the risks and benefits,                            the patient was deemed in satisfactory condition to                            undergo the procedure.  After obtaining informed consent, the colonoscope                            was passed under direct vision. Throughout the                            procedure, the patient's blood pressure, pulse, and                            oxygen saturations were monitored continuously. The                            Olympus CF-HQ190L ([id number]) Colonoscope was                            introduced through  the anus and advanced to the the                            cecum, identified by appendiceal orifice and                            ileocecal valve. The colonoscopy was performed                            without difficulty. The patient tolerated the                            procedure well. The quality of the bowel                            preparation was good. The ileocecal valve,                            appendiceal orifice, and rectum were photographed.                            The bowel preparation used was SUPREP via split                            dose instruction. Scope In: 10:43:48 AM Scope Out: 11:00:39 AM Scope Withdrawal Time: 0 hours 12 minutes 9 seconds  Total Procedure Duration: 0 hours 16 minutes 51 seconds  Findings:                 The perianal and digital rectal examinations were                            normal. Pertinent negatives include normal                            sphincter tone and no palpable rectal lesions.                           A 6 mm polyp was found in the proximal transverse  colon. The polyp was sessile. The polyp was removed                            with a cold snare. Resection and retrieval were                            complete. Estimated blood loss was minimal.                           A 3 mm polyp was found in the sigmoid colon. The                            polyp was sessile. The polyp was removed with a                            cold snare. Resection and retrieval were complete.                            Estimated blood loss was minimal.                           A 2 mm polyp was found in the rectum. The polyp was                            sessile. The polyp was removed with a cold snare.                            Resection and retrieval were complete. Estimated                            blood loss was minimal.                           The exam was otherwise normal throughout the                             examined colon.                           The retroflexed view of the distal rectum and anal                            verge was normal and showed no anal or rectal                            abnormalities. Complications:            No immediate complications. Estimated Blood Loss:     Estimated blood loss was minimal. Impression:               - One 6 mm polyp in the proximal transverse colon,                            removed with a cold snare.  Resected and retrieved.                           - One 3 mm polyp in the sigmoid colon, removed with                            a cold snare. Resected and retrieved.                           - One 2 mm polyp in the rectum, removed with a cold                            snare. Resected and retrieved.                           - The distal rectum and anal verge are normal on                            retroflexion view. Recommendation:           - Patient has a contact number available for                            emergencies. The signs and symptoms of potential                            delayed complications were discussed with the                            patient. Return to normal activities tomorrow.                            Written discharge instructions were provided to the                            patient.                           - Resume previous diet.                           - Continue present medications.                           - Await pathology results.                           - Repeat colonoscopy (date not yet determined) for                            surveillance based on pathology results. Travus Oren E. Stacia, MD 03/13/2024 11:06:35 AM This report has been signed electronically.

## 2024-03-13 NOTE — Progress Notes (Signed)
 Report to PACU, RN, vss, BBS= Clear.

## 2024-03-14 ENCOUNTER — Telehealth: Payer: Self-pay

## 2024-03-14 NOTE — Telephone Encounter (Signed)
  Follow up Call-     03/13/2024    9:47 AM  Call back number  Post procedure Call Back phone  # [phone]  Permission to leave phone message Yes     Patient questions:  Do you have a fever, pain , or abdominal swelling? No. Pain Score  0 *  Have you tolerated food without any problems? Yes.    Have you been able to return to your normal activities? Yes.    Do you have any questions about your discharge instructions: Diet   No. Medications  No. Follow up visit  No.  Do you have questions or concerns about your Care? No.  Actions: * If pain score is 4 or above: No action needed, pain <4.

## 2024-03-16 LAB — SURGICAL PATHOLOGY

## 2024-03-17 ENCOUNTER — Ambulatory Visit: Payer: Self-pay | Admitting: Gastroenterology

## 2024-03-17 NOTE — Progress Notes (Signed)
 Mr. Jeffery Morgan,   The three polyps that I removed during your recent procedure were completely benign but were proven to be pre-cancerous polyps that MAY have grown into cancers if they had not been removed.  Studies shows that at least 20% of women over age 47 and 30% of men over age 38 have pre-cancerous polyps.  Based on current nationally recognized surveillance guidelines, I recommend that you have a repeat colonoscopy in 5 years.   If you develop any new rectal bleeding, abdominal pain or significant bowel habit changes, please contact me before then.
